# Patient Record
Sex: Male | Born: 1975
Health system: Southern US, Community
[De-identification: ages and names within clinical notes are randomized; demographics above are authoritative.]

## PROBLEM LIST (undated history)

## (undated) DIAGNOSIS — K219 Gastro-esophageal reflux disease without esophagitis: Secondary | ICD-10-CM

## (undated) DIAGNOSIS — Z8614 Personal history of Methicillin resistant Staphylococcus aureus infection: Secondary | ICD-10-CM

## (undated) DIAGNOSIS — R112 Nausea with vomiting, unspecified: Secondary | ICD-10-CM

## (undated) DIAGNOSIS — J189 Pneumonia, unspecified organism: Secondary | ICD-10-CM

## (undated) DIAGNOSIS — T7840XA Allergy, unspecified, initial encounter: Secondary | ICD-10-CM

## (undated) DIAGNOSIS — F329 Major depressive disorder, single episode, unspecified: Secondary | ICD-10-CM

## (undated) DIAGNOSIS — F32A Depression, unspecified: Secondary | ICD-10-CM

## (undated) DIAGNOSIS — F17200 Nicotine dependence, unspecified, uncomplicated: Secondary | ICD-10-CM

## (undated) DIAGNOSIS — F419 Anxiety disorder, unspecified: Secondary | ICD-10-CM

## (undated) DIAGNOSIS — J449 Chronic obstructive pulmonary disease, unspecified: Secondary | ICD-10-CM

## (undated) DIAGNOSIS — Z9889 Other specified postprocedural states: Secondary | ICD-10-CM

## (undated) HISTORY — DX: Allergy, unspecified, initial encounter: T78.40XA

## (undated) HISTORY — DX: Major depressive disorder, single episode, unspecified: F32.9

## (undated) HISTORY — DX: Gastro-esophageal reflux disease without esophagitis: K21.9

## (undated) HISTORY — DX: Nicotine dependence, unspecified, uncomplicated: F17.200

## (undated) HISTORY — DX: Chronic obstructive pulmonary disease, unspecified: J44.9

## (undated) HISTORY — PX: BACK SURGERY: SHX140

## (undated) HISTORY — DX: Depression, unspecified: F32.A

## (undated) HISTORY — PX: ROTATOR CUFF REPAIR: SHX139

## (undated) HISTORY — DX: Personal history of Methicillin resistant Staphylococcus aureus infection: Z86.14

---

## 1998-01-24 ENCOUNTER — Emergency Department (HOSPITAL_COMMUNITY): Admission: EM | Admit: 1998-01-24 | Discharge: 1998-01-24 | Payer: Self-pay | Admitting: Emergency Medicine

## 1998-02-01 ENCOUNTER — Emergency Department (HOSPITAL_COMMUNITY): Admission: EM | Admit: 1998-02-01 | Discharge: 1998-02-01 | Payer: Self-pay | Admitting: Emergency Medicine

## 1998-09-30 ENCOUNTER — Emergency Department (HOSPITAL_COMMUNITY): Admission: EM | Admit: 1998-09-30 | Discharge: 1998-09-30 | Payer: Self-pay | Admitting: Emergency Medicine

## 1998-09-30 ENCOUNTER — Encounter: Payer: Self-pay | Admitting: Emergency Medicine

## 1999-06-07 ENCOUNTER — Encounter: Payer: Self-pay | Admitting: Emergency Medicine

## 1999-06-07 ENCOUNTER — Emergency Department (HOSPITAL_COMMUNITY): Admission: EM | Admit: 1999-06-07 | Discharge: 1999-06-07 | Payer: Self-pay | Admitting: Emergency Medicine

## 2000-08-06 ENCOUNTER — Encounter (INDEPENDENT_AMBULATORY_CARE_PROVIDER_SITE_OTHER): Payer: Self-pay | Admitting: *Deleted

## 2000-08-06 ENCOUNTER — Ambulatory Visit (HOSPITAL_COMMUNITY): Admission: RE | Admit: 2000-08-06 | Discharge: 2000-08-06 | Payer: Self-pay | Admitting: Gastroenterology

## 2000-08-07 ENCOUNTER — Ambulatory Visit (HOSPITAL_COMMUNITY): Admission: RE | Admit: 2000-08-07 | Discharge: 2000-08-07 | Payer: Self-pay | Admitting: Gastroenterology

## 2000-08-07 ENCOUNTER — Encounter: Payer: Self-pay | Admitting: Gastroenterology

## 2000-09-21 ENCOUNTER — Emergency Department (HOSPITAL_COMMUNITY): Admission: EM | Admit: 2000-09-21 | Discharge: 2000-09-21 | Payer: Self-pay | Admitting: Emergency Medicine

## 2000-10-26 ENCOUNTER — Encounter: Payer: Self-pay | Admitting: Internal Medicine

## 2000-10-26 ENCOUNTER — Emergency Department (HOSPITAL_COMMUNITY): Admission: EM | Admit: 2000-10-26 | Discharge: 2000-10-26 | Payer: Self-pay | Admitting: Internal Medicine

## 2002-01-06 ENCOUNTER — Encounter: Payer: Self-pay | Admitting: Internal Medicine

## 2002-01-06 ENCOUNTER — Ambulatory Visit (HOSPITAL_COMMUNITY): Admission: RE | Admit: 2002-01-06 | Discharge: 2002-01-06 | Payer: Self-pay | Admitting: Internal Medicine

## 2002-01-10 ENCOUNTER — Encounter: Payer: Self-pay | Admitting: Neurology

## 2002-01-15 ENCOUNTER — Ambulatory Visit (HOSPITAL_COMMUNITY): Admission: RE | Admit: 2002-01-15 | Discharge: 2002-01-15 | Payer: Self-pay | Admitting: Neurology

## 2003-10-23 ENCOUNTER — Emergency Department (HOSPITAL_COMMUNITY): Admission: EM | Admit: 2003-10-23 | Discharge: 2003-10-24 | Payer: Self-pay | Admitting: Emergency Medicine

## 2004-05-02 ENCOUNTER — Encounter: Admission: RE | Admit: 2004-05-02 | Discharge: 2004-05-02 | Payer: Self-pay | Admitting: Family Medicine

## 2004-05-26 ENCOUNTER — Encounter: Admission: RE | Admit: 2004-05-26 | Discharge: 2004-05-26 | Payer: Self-pay | Admitting: Family Medicine

## 2004-05-27 ENCOUNTER — Inpatient Hospital Stay (HOSPITAL_COMMUNITY): Admission: EM | Admit: 2004-05-27 | Discharge: 2004-06-02 | Payer: Self-pay | Admitting: Internal Medicine

## 2004-06-27 ENCOUNTER — Ambulatory Visit: Payer: Self-pay | Admitting: Internal Medicine

## 2004-07-04 ENCOUNTER — Ambulatory Visit: Payer: Self-pay | Admitting: Internal Medicine

## 2004-07-08 ENCOUNTER — Ambulatory Visit: Payer: Self-pay | Admitting: Internal Medicine

## 2004-07-22 ENCOUNTER — Ambulatory Visit: Payer: Self-pay | Admitting: Internal Medicine

## 2004-08-18 ENCOUNTER — Ambulatory Visit: Payer: Self-pay | Admitting: Internal Medicine

## 2004-09-26 ENCOUNTER — Ambulatory Visit: Payer: Self-pay | Admitting: Internal Medicine

## 2006-01-30 ENCOUNTER — Emergency Department (HOSPITAL_COMMUNITY): Admission: EM | Admit: 2006-01-30 | Discharge: 2006-01-30 | Payer: Self-pay | Admitting: Emergency Medicine

## 2006-05-14 ENCOUNTER — Ambulatory Visit: Payer: Self-pay | Admitting: Family Medicine

## 2006-05-21 ENCOUNTER — Ambulatory Visit: Payer: Self-pay | Admitting: Family Medicine

## 2006-07-05 ENCOUNTER — Ambulatory Visit: Payer: Self-pay | Admitting: Family Medicine

## 2006-09-26 ENCOUNTER — Ambulatory Visit: Payer: Self-pay | Admitting: Family Medicine

## 2006-11-01 ENCOUNTER — Encounter: Admission: RE | Admit: 2006-11-01 | Discharge: 2006-11-01 | Payer: Self-pay | Admitting: Family Medicine

## 2006-11-01 ENCOUNTER — Encounter (INDEPENDENT_AMBULATORY_CARE_PROVIDER_SITE_OTHER): Payer: Self-pay | Admitting: *Deleted

## 2006-11-01 ENCOUNTER — Ambulatory Visit: Payer: Self-pay | Admitting: Family Medicine

## 2006-11-02 ENCOUNTER — Ambulatory Visit: Payer: Self-pay | Admitting: Family Medicine

## 2007-06-07 ENCOUNTER — Ambulatory Visit: Payer: Self-pay | Admitting: Family Medicine

## 2007-06-12 ENCOUNTER — Ambulatory Visit: Payer: Self-pay | Admitting: Family Medicine

## 2007-07-22 ENCOUNTER — Ambulatory Visit: Payer: Self-pay | Admitting: Family Medicine

## 2007-12-02 ENCOUNTER — Ambulatory Visit: Payer: Self-pay | Admitting: Family Medicine

## 2007-12-04 ENCOUNTER — Emergency Department (HOSPITAL_COMMUNITY): Admission: EM | Admit: 2007-12-04 | Discharge: 2007-12-04 | Payer: Self-pay | Admitting: Emergency Medicine

## 2008-01-20 ENCOUNTER — Inpatient Hospital Stay (HOSPITAL_COMMUNITY): Admission: RE | Admit: 2008-01-20 | Discharge: 2008-01-22 | Payer: Self-pay | Admitting: *Deleted

## 2008-01-20 ENCOUNTER — Ambulatory Visit: Payer: Self-pay | Admitting: *Deleted

## 2008-01-20 ENCOUNTER — Emergency Department (HOSPITAL_COMMUNITY): Admission: EM | Admit: 2008-01-20 | Discharge: 2008-01-20 | Payer: Self-pay | Admitting: Emergency Medicine

## 2008-01-20 ENCOUNTER — Ambulatory Visit: Payer: Self-pay | Admitting: Family Medicine

## 2008-01-27 ENCOUNTER — Ambulatory Visit: Payer: Self-pay | Admitting: Family Medicine

## 2008-01-28 ENCOUNTER — Ambulatory Visit (HOSPITAL_COMMUNITY): Payer: Self-pay | Admitting: Psychiatry

## 2008-01-30 ENCOUNTER — Ambulatory Visit (HOSPITAL_COMMUNITY): Payer: Self-pay | Admitting: Psychology

## 2008-02-03 ENCOUNTER — Ambulatory Visit: Payer: Self-pay | Admitting: Family Medicine

## 2008-04-16 ENCOUNTER — Ambulatory Visit: Payer: Self-pay | Admitting: Family Medicine

## 2008-05-15 ENCOUNTER — Ambulatory Visit: Payer: Self-pay | Admitting: Family Medicine

## 2008-06-11 ENCOUNTER — Ambulatory Visit: Payer: Self-pay | Admitting: Family Medicine

## 2008-06-30 ENCOUNTER — Ambulatory Visit: Payer: Self-pay | Admitting: Family Medicine

## 2008-07-03 ENCOUNTER — Ambulatory Visit (HOSPITAL_COMMUNITY): Payer: Self-pay | Admitting: Psychology

## 2008-08-02 ENCOUNTER — Emergency Department (HOSPITAL_COMMUNITY): Admission: EM | Admit: 2008-08-02 | Discharge: 2008-08-02 | Payer: Self-pay | Admitting: Emergency Medicine

## 2008-08-18 ENCOUNTER — Inpatient Hospital Stay: Payer: Self-pay | Admitting: Psychiatry

## 2008-09-24 ENCOUNTER — Emergency Department (HOSPITAL_COMMUNITY): Admission: EM | Admit: 2008-09-24 | Discharge: 2008-09-24 | Payer: Self-pay | Admitting: Emergency Medicine

## 2008-09-24 ENCOUNTER — Encounter: Admission: RE | Admit: 2008-09-24 | Discharge: 2008-09-24 | Payer: Self-pay | Admitting: Occupational Medicine

## 2008-11-11 ENCOUNTER — Emergency Department (HOSPITAL_COMMUNITY): Admission: EM | Admit: 2008-11-11 | Discharge: 2008-11-11 | Payer: Self-pay | Admitting: Emergency Medicine

## 2008-11-19 HISTORY — PX: JOINT REPLACEMENT: SHX530

## 2008-12-02 ENCOUNTER — Ambulatory Visit: Payer: Self-pay | Admitting: Internal Medicine

## 2008-12-02 DIAGNOSIS — F17200 Nicotine dependence, unspecified, uncomplicated: Secondary | ICD-10-CM | POA: Insufficient documentation

## 2008-12-02 DIAGNOSIS — R0609 Other forms of dyspnea: Secondary | ICD-10-CM | POA: Insufficient documentation

## 2008-12-02 DIAGNOSIS — J45909 Unspecified asthma, uncomplicated: Secondary | ICD-10-CM | POA: Insufficient documentation

## 2008-12-02 DIAGNOSIS — R062 Wheezing: Secondary | ICD-10-CM | POA: Insufficient documentation

## 2008-12-02 DIAGNOSIS — R0989 Other specified symptoms and signs involving the circulatory and respiratory systems: Secondary | ICD-10-CM

## 2008-12-09 ENCOUNTER — Inpatient Hospital Stay (HOSPITAL_COMMUNITY): Admission: RE | Admit: 2008-12-09 | Discharge: 2008-12-10 | Payer: Self-pay | Admitting: Orthopedic Surgery

## 2009-01-13 ENCOUNTER — Encounter: Payer: Self-pay | Admitting: Internal Medicine

## 2009-01-13 ENCOUNTER — Telehealth: Payer: Self-pay | Admitting: Internal Medicine

## 2009-05-13 ENCOUNTER — Ambulatory Visit: Payer: Self-pay | Admitting: Family Medicine

## 2010-02-10 ENCOUNTER — Ambulatory Visit: Payer: Self-pay | Admitting: Family Medicine

## 2010-02-10 ENCOUNTER — Encounter: Payer: Self-pay | Admitting: Internal Medicine

## 2010-02-11 ENCOUNTER — Telehealth: Payer: Self-pay | Admitting: Gastroenterology

## 2010-02-11 ENCOUNTER — Ambulatory Visit: Payer: Self-pay | Admitting: Internal Medicine

## 2010-02-11 DIAGNOSIS — R079 Chest pain, unspecified: Secondary | ICD-10-CM | POA: Insufficient documentation

## 2010-02-18 ENCOUNTER — Ambulatory Visit: Payer: Self-pay | Admitting: Gastroenterology

## 2010-05-23 ENCOUNTER — Telehealth (INDEPENDENT_AMBULATORY_CARE_PROVIDER_SITE_OTHER): Payer: Self-pay | Admitting: *Deleted

## 2010-05-23 ENCOUNTER — Ambulatory Visit: Payer: Self-pay | Admitting: Internal Medicine

## 2010-05-24 ENCOUNTER — Encounter: Payer: Self-pay | Admitting: Gastroenterology

## 2010-06-21 ENCOUNTER — Ambulatory Visit: Payer: Self-pay | Admitting: Family Medicine

## 2010-09-12 ENCOUNTER — Ambulatory Visit
Admission: RE | Admit: 2010-09-12 | Discharge: 2010-09-12 | Payer: Self-pay | Source: Home / Self Care | Attending: Family Medicine | Admitting: Family Medicine

## 2010-09-20 NOTE — Progress Notes (Signed)
Summary: Rectal/Abd. Pain  Phone Note Call from Patient Call back at [phone]   Caller: Patient Reason for Call: Talk to Nurse Summary of Call: pt. is having severe pain w/his hemorroids and lower abd pain. Requesting sooner appt. than next avail for new pt. Initial call taken by: Karna Christmas,  May 23, 2010 10:37 AM  Follow-up for Phone Call        Pt. will see Mike Gip [facility] today at 1:30pm. Pt's wife to advise pt. on med.list/co-pay. Follow-up by: Laureen Ochs LPN,  May 23, 2010 11:26 AM

## 2010-09-20 NOTE — Procedures (Signed)
Summary: EGD   EGD  Procedure date:  08/06/2000  Findings:      Location: [facility]                          [facility]  Patient:    Evan Bauer, Evan Bauer                    MRN: [id number] Proc. Date: 08/06/00 Adm. Date:  [id number] Attending:  Louie Bun CC:         Ammie Dalton, M.D.   Operative Report  PROCEDURE:  Esophagogastroduodenoscopy.  INDICATION FOR PROCEDURE:  Nausea, vomiting, abdominal pain, and black stool suggesting upper GI bleeding.  DESCRIPTION OF PROCEDURE: The patient was placed in the left lateral decubitus position and placed on the pulse monitor with continuous low-flow oxygen delivered by nasal cannula.  He was sedated with 60 mg IV Demerol and 6 mg IV Versed.  The Olympus video endoscope was advanced under direct vision into the oropharynx and esophagus.  The esophagus was straight and of normal caliber with the squamocolumnar line at 38 cm.  There appeared to be a 2 cm sliding hiatal hernia distal to the Z-line.  There also appeared to be 1-2 discrete erosions extending 1-2 cm from the Z-line with no stigma of hemorrhage.  I did not discern any Mallory-Weiss tear, varices, esophageal ulcer, or other potential bleeding lesion of the distal esophagus or GE junction.  The stomach was entered, and there was a moderate amount of semisolid food in the dependent portions of the fundus.  The patient was known to have eaten 1/2 a chicken sandwich about five hours prior to this procedure.  This limited somewhat the view of the proximal stomach, but there were no visible coffee grounds, blood, or bloody fluid anywhere in the stomach.  The visualized portions of the proximal stomach appeared normal as did the body and antrum which were well visualized.  The pylorus was nondeformed and easily allowed passage of the endoscope tip into the duodenum.  Both bulb and second portion were well inspected and appeared to be within normal  limits.  The endoscope was then withdrawn, and the patient returned to the recovery room in stable condition.  He tolerated the procedure well, and there were no immediate complications.  IMPRESSION:  Moderate erosive esophagitis with no stigma of hemorrhage.  No suggestion of active bleeding proximal to the distal duodenum.  PLAN:  Await lab work to determine whether observation admission is warranted to observe for ongoing GI bleeding and whether lower GI evaluation is needed. DD:  08/06/00 TD:  08/07/00 Job: [id number]

## 2010-09-20 NOTE — Progress Notes (Signed)
  Phone Note From Other Clinic   Summary of Call: Dr. Tenny Craw called about getting him in for appt sooner than mid August (was told this was first available) for NCCP.  he will double his PPI for now.  can you find him a spot in next 1-2 weeks with me, PA or anyone with any openings. Initial call taken by: Rachael Fee MD,  February 11, 2010 2:40 PM     Appended Document:  Given appt. for 02/18/10. wife ntfd.

## 2010-09-20 NOTE — Assessment & Plan Note (Signed)
Summary: np6/ chestpain. arrived @ 1p.m. pt has uhc/ gd   Visit Type:  Initial Consult Primary Provider:  Dr. Susann Givens  CC:  chest pain.  History of Present Illness: Patient is a 35 year old who was referred from Dr. Levonne Spiller office for chest pain. The patient has no known CAD.  On Tuesday he says he was at work.  It was after lunch.  He developed chest tightness.  Became very pale.  Felt sick on stomach.  Short of breath.  Symptoms would wax and wane over next few days.  Episodes were not associated with activity. Note he has a history of erosive esophagitis in2001.  Has not been seen by GI after this.  Does say that he has some uncontrolled reflux symptoms on daily protonix.  Current Medications (verified): 1)  Symbicort 160-4.5 Mcg/act Aero (Budesonide-Formoterol Fumarate) .... 2 Puffs Two Times A Day 2)  Zyrtec Allergy 10 Mg Tabs (Cetirizine Hcl) .... Take 1 Tablet By Mouth Once A Day 3)  Remeron 45 Mg Tabs (Mirtazapine) .Marland Kitchen.. 1 Tab At Bedtime 4)  Effexor Xr 75 Mg Xr24h-Cap (Venlafaxine Hcl) .Marland Kitchen.. 1 Tab Once Daily 5)  Protonix 40 Mg Tbec (Pantoprazole Sodium) .Marland Kitchen.. 1 Tab Once Daily  Allergies (verified): 1)  ! Penicillin 2)  ! Levaquin 3)  ! * Chantix  Past History:  Past Medical History: Last updated: 12/02/2008 #Asthma..............................Marland KitchenDr Marchelle Gearing -> hx of seeing Dr. Sherene Sires prior to 2005. 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 No hx of controller medications up until 10/2008 -> acute bronchospasm at time of scalene block for rotator cuff repair 11/11/2008 -deemed as VCD clinically by inpatient consultation Dr. Delton Coombes. STarted on Symbicort and nasonex empricially -> 12/02/2008 Office spiro  on symbicort - Shows mild obstn. FeV1 3.74L/78%, FVC 4.8L/83%, Ratio 78 (83), Fef 25-75 3.4L67% Seasonal Allergies #GERD Hx #Mood Disorder NOS  -> admitted [facility] June 2009. Per patient this was due to hallucinations from chantix -> depression  that patient claims is due to chantix #Tobacco Abuse -> hx of quitting pulm followup in 2005 due to desire to continue smoking.  -> difficulty quitting due to depression and chantix side effects #Obesity #Hx of Snoring and Excess day time somnolence -> sleep study 2003 hx - normal per patient. 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  NEgatvie to the above problem.  Does note that he is constipated.  Denies food getting stuck with swallowing. Lipids in medicne office:  LDL 105; HDL 25; Trig 194   Vital Signs:  Patient profile:   35 year old male Height:      72 inches Weight:      207 pounds BMI:     28.18 Pulse rate:   72 / minute BP sitting:   117 / 81  (left arm) Cuff size:   regular  Vitals Entered By: Burnett Kanaris, CNA (February 11, 2010 1:50 PM)  Physical Exam  Additional Exam:  Patient is in NAD. HEENT:  Normocephalic, atraumatic.  EOMI, PERRLA.  Neck: JVP is normal. No thyromegaly. No bruits.  Chest Mild tenderness, different from discomfort he experienced this week. Lungs: clear to auscultation. No rales no wheezes.  Heart: Regular rate and rhythm. Normal S1, S2. No S3.   No significant murmurs. PMI not displaced.  Abdomen:  Supple. Very mild right sided tendernaess.. Normal bowel sounds. No masses. No hepatomegaly.  Extremities:   Good distal pulses throughout. No lower extremity edema.  Musculoskeletal :moving all extremities.  Neuro:   alert and oriented x3.    Impression &  Recommendations:  Problem # 1:  CHEST PAIN-UNSPECIFIED (ICD-786.50) I am not convinced that the patient's current chest pain is  cardiac.  On and off, not associated with activiity at all. For the extent of pain that he has had i  would expect to see more evid of problems. He does have a GI Hx. and notes that he is having break through reflux.  (and also constipation). I have recommended that he increase Protonix to two times a day.  Also take surfak.  I have contacted GI Kennedy Bucker) and will have him set up for an appt in the next 1 to 2 wks.  Problem # 2:  TOBACCO ABUSE (ICD-305.1) Counselled.  He is not interested in quitting now since he was suicidal on Chantix. Prescriptions: COLACE 100 MG CAPS (DOCUSATE SODIUM) 1 cap two times a day  #60 x 6   Entered by:   Layne Benton, RN, BSN   Authorized by:   Sherrill Raring, MD, [facility]   Signed by:   Layne Benton, RN, BSN on 02/11/2010   Method used:   Electronically to        [facility] (retail)       [address]       [place], [place]  [id number]       Ph: [phone]       Fax: [phone]   RxID:   [id number] PROTONIX 40 MG TBEC (PANTOPRAZOLE SODIUM) 1 tab 2 times per day  #60 x 1   Entered by:   Layne Benton, RN, BSN   Authorized by:   Sherrill Raring, MD, [facility]   Signed by:   Layne Benton, RN, BSN on 02/11/2010   Method used:   Electronically to        [facility] (retail)       [address]       [place], [place]  [id number]       Ph: [phone]       Fax: [phone]   RxID:   [id number]

## 2010-09-20 NOTE — Letter (Signed)
Summary: [facility]   Imported By: Sherian Rein 06/07/2010 11:16:41  _____________________________________________________________________  External Attachment:    Type:   Image     Comment:   External Document

## 2010-09-20 NOTE — Letter (Signed)
Summary: [facility]  [facility]   Imported By: Marylou Mccoy 03/02/2010 12:23:06  _____________________________________________________________________  External Attachment:    Type:   Image     Comment:   External Document

## 2010-09-20 NOTE — Assessment & Plan Note (Signed)
Summary: SEVERE RECTAL PAIN AND LOWER ABD. PAIN.    (NEW TO GI)   Evan Bauer   History of Present Illness Visit Type: Initial Visit Primary GI MD: Rob Bunting MD Primary Provider: Sharlot Gowda, MD Chief Complaint: severe rectal and abdominal pain History of Present Illness:   PLEASANT 34 YO MALE NEW TO GI TODAY. HE REPOTS HX OF HEMORRHOID SXS IN THE PAST BUT SAYS HAS MANAGED HIMSELF IN THE PAST.  HE COMES IN TODAY AFTER HAVING A SMALL AMT OF RECTAL BLEEDING ON 05/20/10. NO OTHER SXS UNTIL LAST NIGHT AFTER HE HAD DONE HEAVY LABOR FOR SEVERAL HOURS YESTERDAY. HE BEGAN HAVING RECTAL PAIN WHICH HAS PROGRESSED AND KEPT HIM FROM SLEEPING LAST NIGHT. HE CANNOT SIT WITHOUT INCREASED PAIN. NO BM PAST 2 DAYS. HE FEELS A KNOT ON THE OUTSIDE OF HIS RECTUM AND PAIN INTERNALLY. NO FEVER/CHILLS.  HE IS ON TWICE DAILY PROTONIX  BECAUSE OF RELUX WHICH REPOTEDLY WAS CAUSING BRONCHOSPASM, NO CURRENT SXS.   GI Review of Systems      Denies abdominal pain, acid reflux, belching, bloating, chest pain, dysphagia with liquids, dysphagia with solids, heartburn, loss of appetite, nausea, vomiting, vomiting blood, weight loss, and  weight gain.      Reports hemorrhoids, rectal bleeding, and  rectal pain.     Denies anal fissure, black tarry stools, change in bowel habit, constipation, diarrhea, diverticulosis, fecal incontinence, heme positive stool, irritable bowel syndrome, jaundice, light color stool, and  liver problems.    Current Medications (verified): 1)  Symbicort 160-4.5 Mcg/act Aero (Budesonide-Formoterol Fumarate) .... 2 Puffs Two Times A Day 2)  Zyrtec Allergy 10 Mg Tabs (Cetirizine Hcl) .... Take 1 Tablet By Mouth Once A Day 3)  Remeron 45 Mg Tabs (Mirtazapine) .Marland Kitchen.. 1 Tab At Bedtime 4)  Effexor Xr 75 Mg Xr24h-Cap (Venlafaxine Hcl) .Marland Kitchen.. 1 Tab Once Daily 5)  Protonix 40 Mg Tbec (Pantoprazole Sodium) .Marland Kitchen.. 1 Tab 2 Times Per Day 6)  Colace 100 Mg Caps (Docusate Sodium) .Marland Kitchen.. 1 Cap Two Times A  Day  Allergies: 1)  ! Penicillin 2)  ! Levaquin 3)  ! * Chantix  Past History:  Past Medical History: #Asthma..............................Marland KitchenDr Marchelle Gearing -> hx of seeing Dr. Sherene Sires prior to 2005. 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 No hx of controller medications up until 10/2008 -> acute bronchospasm at time of scalene block for rotator cuff repair 11/11/2008 -deemed as VCD clinically by inpatient consultation Dr. Delton Coombes. STarted on Symbicort and nasonex empricially -> 12/02/2008 Office spiro  on symbicort - Shows mild obstn. FeV1 3.74L/78%, FVC 4.8L/83%, Ratio 78 (83), Fef 25-75 3.4L67% Seasonal Allergies #GERD Hx #Mood Disorder NOS  -> admitted [facility] June 2009. Per patient this was due to hallucinations from chantix -> depression that patient claims is due to chantix #Tobacco Abuse -> hx of quitting pulm followup in 2005 due to desire to continue smoking.  -> difficulty quitting due to depression and chantix side effects #Obesity #Hx of Snoring and Excess day time somnolence -> sleep study 2003 hx - normal per patient. -> weight gain 50# since 2003 but typically loses 20# in summer due to work  Past Surgical History: Reviewed history from 12/02/2008 and no changes required. Right Rotator Cuff Tear - surgery pending Dr Thurston Hole  Family History: Reviewed history from 12/02/2008 and no changes required. Father-emphysema, allergies, asthma No FH of Colon Cancer:  Social History: Reviewed history from 02/11/2010 and no changes required. 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  The patient denies anemia, anxiety-new, arthritis/joint pain, back pain, blood in urine, breast changes/lumps, change in vision, confusion, cough, coughing up blood, depression-new, fainting, fatigue, fever, headaches-new, hearing  problems, heart murmur, heart rhythm changes, itching, muscle pains/cramps, night sweats, nosebleeds, shortness of breath, skin rash, sleeping problems, sore throat, swelling of feet/legs, swollen lymph glands, thirst - excessive, urination - excessive, urination changes/pain, urine leakage, vision changes, and voice change.         SEE HPI  Vital Signs:  Patient profile:   35 year old male Height:      72 inches Weight:      223.38 pounds BMI:     30.41 Pulse rate:   100 / minute Pulse rhythm:   regular BP sitting:   138 / 92  (left arm)  Vitals Entered By: Milford Cage NCMA (May 23, 2010 1:16 PM)  Physical Exam  General:  Well developed, well nourished, no acute distress. Head:  Normocephalic and atraumatic. Eyes:  PERRLA, no icterus. Lungs:  Clear throughout to auscultation. Heart:  Regular rate and rhythm; no murmurs, rubs,  or bruits. Abdomen:  SOFT, NONTENDER, NO MASS OR HSM,BS+ Rectal:  PERIRECTAL INDURATION,ERYTHEMA AND EXQUISITE TENDERNESS ON LEFT  ,VERY TENDER DIGITAL EXAM,AND ANOSCOPY-NO HEMORRHOID OR FISSURE IDENTIFIED, SOME INDURATION LEFT ANORECTUM. Neurologic:  Alert and  oriented x4;  grossly normal neurologically. Psych:  Alert and cooperative. Normal mood and affect.   Impression & Recommendations:  Problem # 1:  PERI-RECTAL ABSCESS Assessment New 34 YO MALE WITH LEFT PERIRECTAL ABSCESS.  START KEFLEX 500 MG 3 TIMES DAILY X 10  DAYS(ALLERGIC TO PCN, AND QUINOLONES) SITZ BATHS TWICE DAILY WITH HOT WATER SURGICAL REFERRAL WITHIN NEXT 24 HOURS. OFFERED ANALGESIC-HE DECLINES, WILL USE EXTRA STRENGTH TYLENOL  AS NEEDED.  Patient Instructions: 1)  We called a prescription to [facility]  for Keflex. 2)  You can take Extrastrength Tylenol.  3)  You can do warm soaks in the bathtub for 10 min several times before appointment at [facility]. 4)  Your appointment is on tomorrow 05-24-10, arrive at 4:00 PM to [facility]. 5)   Appoiontment sheet with directions given. 6)  Copy sent to :  Sharlot Gowda, Md 7)  The medication list was reviewed and reconciled.  All changed / newly prescribed medications were explained.  A complete medication list was provided to the patient / caregiver. Prescriptions: KEFLEX 500 MG CAPS (CEPHALEXIN) Take 1 tab every 8 hours x 10 days  #30 x 0   Entered by:   Lowry Ram NCMA   Authorized by:   Sammuel Cooper PA-c   Signed by:   Lowry Ram NCMA on 05/23/2010   Method used:   Electronically to        [facility] (retail)       [address]       [place], [place]  [id number]       Ph: [phone]       Fax: [phone]   RxID:   [id number]

## 2010-09-20 NOTE — Consult Note (Signed)
[facility]  Patient:    Evan Bauer, Evan Bauer                    MRN: [id number] Proc. Date: 08/06/00 Adm. Date:  [id number] Attending:  Hayes, John Charles CC:         Cynthia Childress, M.D.   Consultation Report  REASON FOR CONSULTATION:  Nausea, vomiting, abdominal pain and heme-positive stool.  HISTORY OF ILLNESS:  Patient is a 35-year-old white male who was in previously good health until he began experiencing a headache and malaise Saturday night, followed by the development of periumbilical abdominal pain Sunday morning. He noted that the pain tended to be worse when he was sitting or standing and better when lying down.  He vomited once yesterday with the appearance of partially digested food and no coffee-grounds.  He does not recall having a bowel movement yesterday.  He went to work early in the day and after he vomited, came home and stayed in bed the remainder of the day and night.  This morning when he awoke, he began having abdominal pain again and had a total of four bowel movements which are described as black and somewhat tarry, without diarrhea or bright red blood per rectum.  He was confirmed to be heme-positive, with a melenic appearance to his stool, by Dr. Cynthia Childress and referred here.  Vital signs are stable and CBC and BMET are pending at the time of this dictation.  Patient denies any history of GI bleeding, peptic ulcer disease or use of nonsteroidal anti-inflammatory drugs.  PAST MEDICAL HISTORY:  Essentially unremarkable.  PAST SURGICAL HISTORY:  None.  HOSPITALIZATIONS:  None.  ALLERGIES:  PENICILLIN.  FAMILY HISTORY:  Father has Alzheimers disease.  No family history of peptic ulcer disease.  SOCIAL HISTORY:  Patient is married.  He has two jobs.  His wife is expecting their first child.  He admits to cigarette smoking and denies alcohol use.  PHYSICAL EXAMINATION  GENERAL:  Well-developed, well-nourished  white male in no acute distress.  HEENT:  Unremarkable.  HEART:  Regular rate and rhythm without murmur.  LUNGS:  Clear.  ABDOMEN:  Soft, nondistended, with normoactive bowel sounds.  No hepatosplenomegaly, mass or guarding.  There is mild periumbilical abdominal tenderness.  RECTAL:  Exam was not repeated.  LABORATORY DATA:  Pending.  IMPRESSION:  Nausea, vomiting, abdominal pain and black stools suggestive of an upper gastrointestinal bleed.  PLAN:  Will proceed with esophagogastroduodenoscopy.  Will await CBC and BMET as well. DD:  08/06/00 TD:  08/07/00 Job: [id number]    [facility]  Patient:    Evan Bauer                    MRN: [id number] Proc. Date: 08/06/00 Adm. Date:  [id number] Attending:  Louie Bun CC:         Ammie Dalton, M.D.   Consultation Report  REASON FOR CONSULTATION:  Nausea, vomiting, abdominal pain and heme-positive stool.  HISTORY OF ILLNESS:  Patient is a 35 year old white male who was in previously good health until he began experiencing a headache and malaise Saturday night, followed by the development of periumbilical abdominal pain Sunday morning. He noted that the pain tended to be worse when he was sitting or standing and better when lying down.  He vomited once yesterday with the appearance of partially digested food and no coffee-grounds.  He does not recall having a bowel movement yesterday.  He went to work early in the day and after he vomited, came home and stayed in bed the remainder of the day and night.  This morning when he awoke, he began having abdominal pain again and had a total of four bowel movements which are described as black and somewhat tarry, without diarrhea or bright red blood per rectum.  He was confirmed to be heme-positive, with a melenic appearance to his stool, by Dr. Ammie Dalton and referred here.  Vital signs are stable and CBC and BMET are pending at the time of this dictation.  Patient denies any history of GI bleeding, peptic ulcer disease or use of nonsteroidal anti-inflammatory drugs.  PAST MEDICAL HISTORY:  Essentially unremarkable.  PAST SURGICAL HISTORY:  None.  HOSPITALIZATIONS:  None.  ALLERGIES:  PENICILLIN.  FAMILY HISTORY:  Father has Alzheimers disease.  No family history of peptic ulcer disease.  SOCIAL HISTORY:  Patient is married.  He has two jobs.  His wife is expecting their first child.  He admits to cigarette smoking and denies alcohol use.  PHYSICAL EXAMINATION  GENERAL:  Well-developed,  well-nourished white male in no acute distress.  HEENT:  Unremarkable.  HEART:  Regular rate and rhythm without murmur.  LUNGS:  Clear.  ABDOMEN:  Soft, nondistended, with normoactive bowel sounds.  No hepatosplenomegaly, mass or guarding.  There is mild periumbilical abdominal tenderness.  RECTAL:  Exam was not repeated.  LABORATORY DATA:  Pending.  IMPRESSION:  Nausea, vomiting, abdominal pain and black stools suggestive of an upper gastrointestinal bleed.  PLAN:  Will proceed with esophagogastroduodenoscopy.  Will await CBC and BMET as well. DD:  08/06/00 TD:  08/07/00 Job: [id number]

## 2010-09-23 ENCOUNTER — Ambulatory Visit: Payer: Self-pay | Admitting: Family Medicine

## 2010-11-15 ENCOUNTER — Other Ambulatory Visit: Payer: Self-pay

## 2010-11-15 DIAGNOSIS — K219 Gastro-esophageal reflux disease without esophagitis: Secondary | ICD-10-CM

## 2010-11-15 MED ORDER — PANTOPRAZOLE SODIUM 40 MG PO TBEC: 40.0000 mg | DELAYED_RELEASE_TABLET | Freq: Two times a day (BID) | ORAL | Status: DC

## 2010-11-30 LAB — BASIC METABOLIC PANEL
BUN: 6 mg/dL (ref 6–23)
CO2: 30 mEq/L (ref 19–32)
Calcium: 9 mg/dL (ref 8.4–10.5)
Chloride: 101 mEq/L (ref 96–112)
Creatinine, Ser: 1.07 mg/dL (ref 0.4–1.5)
GFR calc Af Amer: >60 mL/min (ref >60)
GFR calc non Af Amer: >60 mL/min (ref >60)
Glucose, Bld: 117 mg/dL — ABNORMAL HIGH (ref 70–99)
Potassium: 3.4 mEq/L — ABNORMAL LOW (ref 3.5–5.1)
Sodium: 136 mEq/L (ref 135–145)

## 2010-11-30 LAB — CBC
HCT: 44.8 % (ref 39.0–52.0)
HCT: 46.8 % (ref 39.0–52.0)
Hemoglobin: 15.8 g/dL (ref 13.0–17.0)
Hemoglobin: 16.2 g/dL (ref 13.0–17.0)
MCHC: 34.7 g/dL (ref 30.0–36.0)
MCHC: 35.2 g/dL (ref 30.0–36.0)
MCV: 90.3 fL (ref 78.0–100.0)
MCV: 90.8 fL (ref 78.0–100.0)
Platelets: 228 10*3/uL (ref 150–400)
Platelets: 271 10*3/uL (ref 150–400)
RBC: 4.96 MIL/uL (ref 4.22–5.81)
RBC: 5.15 MIL/uL (ref 4.22–5.81)
RDW: 12.6 % (ref 11.5–15.5)
RDW: 12.6 % (ref 11.5–15.5)
WBC: 11.9 10*3/uL — ABNORMAL HIGH (ref 4.0–10.5)
WBC: 7.1 10*3/uL (ref 4.0–10.5)

## 2010-11-30 LAB — COMPREHENSIVE METABOLIC PANEL
ALT: 24 U/L (ref 0–53)
AST: 20 U/L (ref 0–37)
Albumin: 3.8 g/dL (ref 3.5–5.2)
Alkaline Phosphatase: 75 U/L (ref 39–117)
BUN: 9 mg/dL (ref 6–23)
CO2: 27 mEq/L (ref 19–32)
Calcium: 9.2 mg/dL (ref 8.4–10.5)
Chloride: 105 mEq/L (ref 96–112)
Creatinine, Ser: 1.07 mg/dL (ref 0.4–1.5)
GFR calc Af Amer: >60 mL/min (ref >60)
GFR calc non Af Amer: >60 mL/min (ref >60)
Glucose, Bld: 95 mg/dL (ref 70–99)
Potassium: 3.7 mEq/L (ref 3.5–5.1)
Sodium: 139 mEq/L (ref 135–145)
Total Bilirubin: 0.6 mg/dL (ref 0.3–1.2)
Total Protein: 6.3 g/dL (ref 6.0–8.3)

## 2010-11-30 LAB — DIFFERENTIAL
Basophils Absolute: 0 10*3/uL (ref 0.0–0.1)
Basophils Relative: 0 % (ref 0–1)
Eosinophils Absolute: 0.4 10*3/uL (ref 0.0–0.7)
Eosinophils Relative: 5 % (ref 0–5)
Lymphocytes Relative: 29 % (ref 12–46)
Lymphs Abs: 2.1 10*3/uL (ref 0.7–4.0)
Monocytes Absolute: 0.6 10*3/uL (ref 0.1–1.0)
Monocytes Relative: 9 % (ref 3–12)
Neutro Abs: 4 10*3/uL (ref 1.7–7.7)
Neutrophils Relative %: 57 % (ref 43–77)

## 2010-11-30 LAB — PROTIME-INR
INR: 0.9 (ref 0.00–1.49)
Prothrombin Time: 12.4 seconds (ref 11.6–15.2)

## 2010-11-30 LAB — APTT: aPTT: 31 seconds (ref 24–37)

## 2010-12-05 ENCOUNTER — Emergency Department (HOSPITAL_COMMUNITY): Payer: 59

## 2010-12-05 ENCOUNTER — Emergency Department (HOSPITAL_COMMUNITY)
Admission: EM | Admit: 2010-12-05 | Discharge: 2010-12-05 | Disposition: A | Discharge status: Home / Self Care | Payer: 59 | Attending: Emergency Medicine | Admitting: Emergency Medicine

## 2010-12-05 DIAGNOSIS — F329 Major depressive disorder, single episode, unspecified: Secondary | ICD-10-CM | POA: Insufficient documentation

## 2010-12-05 DIAGNOSIS — R0609 Other forms of dyspnea: Secondary | ICD-10-CM | POA: Insufficient documentation

## 2010-12-05 DIAGNOSIS — J45909 Unspecified asthma, uncomplicated: Secondary | ICD-10-CM | POA: Insufficient documentation

## 2010-12-05 DIAGNOSIS — F411 Generalized anxiety disorder: Secondary | ICD-10-CM | POA: Insufficient documentation

## 2010-12-05 DIAGNOSIS — Z79899 Other long term (current) drug therapy: Secondary | ICD-10-CM | POA: Insufficient documentation

## 2010-12-05 DIAGNOSIS — R0989 Other specified symptoms and signs involving the circulatory and respiratory systems: Secondary | ICD-10-CM | POA: Insufficient documentation

## 2010-12-05 DIAGNOSIS — I10 Essential (primary) hypertension: Secondary | ICD-10-CM | POA: Insufficient documentation

## 2010-12-05 DIAGNOSIS — F3289 Other specified depressive episodes: Secondary | ICD-10-CM | POA: Insufficient documentation

## 2010-12-06 ENCOUNTER — Inpatient Hospital Stay (INDEPENDENT_AMBULATORY_CARE_PROVIDER_SITE_OTHER): Payer: 59 | Admitting: Family Medicine

## 2010-12-06 DIAGNOSIS — J4489 Other specified chronic obstructive pulmonary disease: Secondary | ICD-10-CM

## 2010-12-06 DIAGNOSIS — J449 Chronic obstructive pulmonary disease, unspecified: Secondary | ICD-10-CM

## 2010-12-06 DIAGNOSIS — J45909 Unspecified asthma, uncomplicated: Secondary | ICD-10-CM

## 2011-01-03 NOTE — H&P (Signed)
Evan Bauer, Evan Bauer NO.:  [id number]   MEDICAL RECORD NO.:  [id number]          PATIENT TYPE:  IPS   LOCATION:  0306                          FACILITY:  [facility]   PHYSICIAN:  Jasmine Pang, M.D. DATE OF BIRTH:  11/22/1975   DATE OF ADMISSION:  01/20/2008  DATE OF DISCHARGE:                       PSYCHIATRIC ADMISSION ASSESSMENT   A 35 year old male voluntarily admitted on January 21, 2008.   HISTORY OF PRESENT ILLNESS:  The patient presents with a history of  depression, having suicidal thoughts.  He had left a suicide note for  his wife, and she apparently had found this note and had called the  police.  The patient was sitting in his car, was going to cut himself.  Stated he knew how to cut himself.  He reports having increased mood  swings, feeling very rageful.  States that these symptoms had started  after he was on Chantix for smoking cessation approximately 6 weeks ago.  He does not want to hurt himself.  He states he has no reason to feel  like he does.  He recently bought a home.  He has a wonderful wife and  three children.  He does not feel like himself and again is uncertain  whether he would try to harm himself at this time.   PAST PSYCHIATRIC HISTORY:  First admission to [facility].  He has had no prior psychiatric hospitalizations or any current  outpatient mental health treatment.   SOCIAL HISTORY:  This is a married male with three young children.  He  works for the [employer] for the past 9 years.  Has a twelfth  grade education.   FAMILY HISTORY:  Father with depression.   ALCOHOL AND DRUG HISTORY:  Nonsmoker.  No alcohol or drug use.   Primary care Evan Bauer is Dr. Susann Givens.   MEDICAL PROBLEMS:  Asthma.  Was recently given a prescription for some  Xanax to help control his anxiety.  Albuterol inhaler as needed.   DRUG ALLERGIES:  LEVAQUIN, PENICILLIN, AND CHANTIX.   PHYSICAL EXAMINATION:  This is well-developed  healthy-appearing male who  was fully assessed at [facility].  His physical exam was reviewed.  No  significant findings.  He did receive Ativan.  Temperature was 97, 61  heart rate, 16 respirations, blood pressure is 124/83, 6 feet, 1 inch  tall, 202 pounds.   LABORATORY DATA:  CBC is within normal limits.  Alcohol level less than  5.  Urine drug screen is positive for benzodiazepines.  Acetaminophen  level less than 10.   MENTAL STATUS EXAM:  He is fully alert, cooperative.  Fair eye contact.  Casually dressed.  Speech is clear, normal pace and tone.  The patient's  mood is worthless.  The patient's affect:  He gets very tearful,  anxious, wanting to feel better.  Thought process:  Endorsing suicidal  thoughts, although thoughts are not active.  Denies any homicidal  ideations.  No evidence of any delusional thinking.  Cognitive function  intact.  Memory is good.  Judgment and insight are fair.  He appears  sincere.   AXIS I:  Mood disorder NOS.  AXIS II:  Deferred.  AXIS III:  No known medical conditions.  AXIS IV:  Deferred.  AXIS V:  Current is 35.   PLAN:  He will have Risperdal, will give a dose now.  Will have 0.5 at  bedtime to help control mood and anxiety and will also have Ambien  available for sleep.  We will contact wife for concerns and safety  issues.  The case manager will assess his followup.  He will also be  encouraged to go to the blue team.  His tentative length of stay is 3 to  5 days.      Landry Corporal, N.P.      Jasmine Pang, M.D.  Electronically Signed    JO/MEDQ  D:  01/21/2008  T:  01/21/2008  Job:  [id number]

## 2011-01-03 NOTE — Consult Note (Signed)
Evan Bauer, Evan Bauer NO.:  [id number]   MEDICAL RECORD NO.:  [id number]          PATIENT TYPE:  EMS   LOCATION:  MAJO                         FACILITY:  [facility]   PHYSICIAN:  Leslye Peer, MD    DATE OF BIRTH:  06-06-1976   DATE OF CONSULTATION:  DATE OF DISCHARGE:                                 CONSULTATION   REQUESTING PHYSICIAN:  Robert A. Thurston Hole, MD   REASON FOR CONSULTATION:  Respiratory distress.   HISTORY OF PRESENT ILLNESS:  This is a 35 year old white male patient  who was scheduled to undergo orthopedic procedure today on November 12, 2008.  Apparently, he has a history of asthma, had decided to  discontinue seeing the pulmonary practice approximately 5 years ago  because of the time he wanted to continue smoking.  Apparently, [facility], given IV sedation underwent a scalene  block, and when he awoke, found he could not move his right side, this  triggered extreme panic, followed by shortness of breath, wheezing,  chest discomfort, and reported bronchospasm with chest tightness.  He  was immediately transferred to [facility] Room from the  surgical center, he was given bronchodilators in the form of albuterol.  En route upon time of pulmonary evaluation, the patient was awake,  alert, oriented and in no respiratory distress.  His lungs fields were  clear, with only small residual amount of upper airway wheeze.  We had  been asked to evaluate the patient given his distant history with  pulmonary, and concern for readiness for surgery.   Past medical history includes difficult to treat asthma followed by Dr.  Sandrea Hughs, last seen approximately 5 years ago.  It was noted at that  point that his dyspnea symptoms were out of proportion to both  structural lung findings and pulmonary function testing consistent with  question of vocal cord dysfunction/upper airway instability.  Of note,  Evan Bauer does not have  long-acting beta-agonist at this time nor  inhaled corticosteroids, on a typical day, he uses short-acting beta-  agonist approximately every 2 hours for symptoms of dyspnea.  He also  has a history of anxiety and depression, and as well as an active  smoker, seasonal allergies, and gastroesophageal reflux disease.   SOCIAL HISTORY:  Currently disabled, is an active smoker.   FAMILY HISTORY:  Not applicable.   CURRENT MEDICATIONS:  1. Abilify 5 mg.  2. Remeron 30.  3. Nexium 40 mg.  4. Provigil 200.  5. Zyrtec 10.  6. Albuterol MDI p.r.n.   REVIEW OF SYSTEMS:  Per HPI.   PHYSICAL EXAMINATION:  VITAL SIGNS:  Heart rate 106, blood pressure  135/91, respirations 26, saturation 100%, temperature pending.  GENERAL:  This is a well-developed, anxious 35 year old male patient,  currently in no acute distress.  HEENT:  Normocephalic.  No JVD.  No adenopathy.  There is a faint upper  airway wheeze; however, the pulmonary fields are clear to auscultation.  CARDIAC:  Regular rate and rhythm.  EXTREMITIES:  Without edema.  ABDOMEN:  Soft, nontender.  He does have right-sided decreased strength  following scaling block.  NEUROLOGIC:  Grossly intact and anxious.   IMPRESSION AND PLAN:  Probable vocal cord dysfunction with acute episode  of exacerbation.  Plus/minus to airflow limitations/reactive airway  disease.  As noted above, he does use his short-acting beta-agonist  approximately every 2 hours at baseline.  Therefore, plan at this point  will be to reinitiate more long-acting baseline regimen of inhaled  corticosteroids and long-acting beta-agonist.  For this, we will start  him on Symbicort q. week limit, dry power exposure which hopefully will  also decrease risk of upper airway irritation.  Additionally, we would  recommend him to continue current Nexium which he says he gets good  relief from his reflux for and then finally we will add Nasonex b.i.d.  to help with symptoms of  seasonal allergy, and more recent postnasal  drip which may also be exacerbating upper airway irritation and finally  we have scheduled him to see Dr. Kalman Shan on December 02, 2008 at  10 a.m., at which time, we can further evaluate the true extensiveness  of his airflow limitations, and hopefully be reevaluate for readiness  for orthopedic surgery.  Additionally, of course, we have counseled Mr.  Bauer in the importance of smoking cessation.      Evan Resides, NP      Leslye Peer, MD  Electronically Signed    PB/MEDQ  D:  11/11/2008  T:  11/12/2008  Job:  [id number]

## 2011-01-03 NOTE — Discharge Summary (Signed)
Evan Bauer, Evan Bauer             ACCOUNT NO.:  [id number]   MEDICAL RECORD NO.:  [id number]          PATIENT TYPE:  IPS   LOCATION:  0306                          [facility]  [facility]   PHYSICIAN:  Jasmine Pang, M.D. DATE OF BIRTH:  06-Mar-1976   DATE OF ADMISSION:  01/20/2008  DATE OF DISCHARGE:  01/22/2008                               DISCHARGE SUMMARY   IDENTIFYING INFORMATION:  This is a 35 year old married white male who  was admitted on a voluntary basis on January 20, 2008.   HISTORY OF PRESENT ILLNESS:  The patient has a history of depression  with suicidal ideations.  He had increased mood swings and has been  states he has been rageful.  He states this started when he began  Chantix approximately 6 weeks ago.  He states he left a suicide note,  but does not remember doing this.  He states he was going to cut  himself.  His wife called the police because she was afraid he would  harm himself.  He states he has no reason to feel this way, though he  does have some job stress.  He believes that it was the Chantix that has  caused the change in his mood.   PAST PSYCHIATRIC HISTORY:  This is the first [facility] admission for the  patient.  He has had no prior admissions.   FAMILY HISTORY:  Father has depression.   SUBSTANCE ABUSE:  Nonsmoker.  No alcohol or drug use.   MEDICAL PROBLEMS:  Asthma.   MEDICATIONS:  Xanax and albuterol.   DRUG ALLERGIES:  LEVAQUIN, PENICILLIN, and CHANTIX.   PHYSICAL FINDINGS:  There were no acute physical or medical problems  noted on exam. A complete physical exam was done at [facility].   DIAGNOSTIC STUDIES:  CBC was within normal limits.  Alcohol level was  less than 5.  UDS was positive for benzodiazepines (the patient states  his wife gave him a Xanax to try to help calm him down).  Acetaminophen  was less than 10.   HOSPITAL COURSE:  Upon admission, the patient was started on his  albuterol inhaler 2 puffs q.4 h. p.r.n., Librium  detox protocol, and  trazodone 50 mg p.o. q.h.s. p.r.n. insomnia.  On January 21, 2008, due to  his history of mood swings, he was started on Risperdal 0.25 mg now,  then 0.5 mg p.o. q.h.s.  He was also started on Risperdal M-Tab 0.5 mg  p.o. q.6 h. p.r.n. agitation. The patient in individual sessions with  me, was friendly and cooperative.  He states he does have mood swings.  They worsened after starting the Chantix.  He states he has had a job  with the [employer] for 9 years and this was rather stressful.  He went to  [facility] days ago and they released him.  He is married  with 3 children.  No drugs or alcohol use.  Positive family history of  depression.  On January 22, 2008, mental status had improved markedly from  admission status.  The patient was less depressed and less  anxious. His  sleep was good.  Appetite was good.  His mood was stabilized.  He was  euthymic feel wonderful.  Affect was wide range.  There was no  suicidal or homicidal ideation.  No auditory or visual hallucinations.  No thoughts of self-injurious behavior.  No paranoia or delusions.  Thoughts were logical and goal-directed.  Thought content, no  predominant theme.  Cognitive was grossly back to baseline.  It was felt  the patient would be safe to go home today.  His wife felt he was ready  for discharge.  He was tolerating the Risperdal well.  He was wanting to  go back to work next week.   DISCHARGE DIAGNOSES:  Axis I:  Mood disorder, not otherwise specified.  Axis II:  None.  Axis III:  None.  Axis IV:  Moderate ( occupational problems, some problems with primary  support group).  Axis V:  Global assessment of functioning was 50 upon discharge.  GAF  was 35 upon admission.  GAF highest past year was 75-80.   DISCHARGE PLANS:  There was no specific activity level or dietary  restrictions.   POSTHOSPITAL CARE PLANS:  The patient will see Dr. Lolly Mustache on March 17, 2008, at 12 p.m. at the [facility] in  [place].  He will also see Dr. Kieth Brightly, a  psychologist on January 30, 2008, at 9 a.m. at the [facility].   DISCHARGE MEDICATIONS:  1. Risperdal 0.5 mg at bedtime.  2. Albuterol inhaler 2 puffs p.o. q.6 h. p.r.n.      Jasmine Pang, M.D.  Electronically Signed     BHS/MEDQ  D:  01/22/2008  T:  01/22/2008  Job:  [id number]

## 2011-01-03 NOTE — Op Note (Signed)
Evan Bauer, Evan Bauer             ACCOUNT NO.:  [id number]   MEDICAL RECORD NO.:  [id number]          PATIENT TYPE:  INP   LOCATION:  5019                         FACILITY:  [facility]   PHYSICIAN:  Elana Alm. Thurston Hole, M.D. DATE OF BIRTH:  Jun 08, 1976   DATE OF PROCEDURE:  12/09/2008  DATE OF DISCHARGE:                               OPERATIVE REPORT   PREOPERATIVE DIAGNOSES:  1. Right shoulder superior labrum anterior and posterior tear.  2. Right shoulder possible loose body.  3. Right shoulder impingement.   POSTOPERATIVE DIAGNOSES:  1. Right shoulder superior labrum anterior and posterior tear.  2. Right shoulder possible loose body.  3. Right shoulder impingement.   PROCEDURE:  1. Right shoulder examination under anesthesia followed by      arthroscopically assisted superior labrum anterior and posterior      repair using Arthrex push lock anchors x3.  2. Right shoulder loose body excision with inferior labral      debridement.  3. Right shoulder subacromial decompression.   SURGEON:  Elana Alm. Thurston Hole, MD   ASSISTANT:  Julien Girt, PA   ANESTHESIA:  General.   OPERATIVE TIME:  One hour.   COMPLICATIONS:  None.   INDICATIONS FOR PROCEDURE:  Evan Bauer is a 35 year old gentleman who  injured his right shoulder at work on September 24, 2008, when he slipped  on ice.  He has had persistent significant pain with exam and MRI  documenting a superior labrum tear, possible loose bodies with  impingement.  He has failed conservative care and is now to undergo  arthroscopy.   DESCRIPTION:  Evan Bauer was brought to the operating room on December 09, 2008, placed on operative table in supine position.  After an adequate  level of general anesthesia was obtained, his right shoulder was  examined.  He had full range of motion in his shoulder with stable  ligamentous exam.  He was then placed in beach-chair position and  shoulder arm was prepped using sterile DuraPrep and draped  using sterile  technique.  He received Ancef 1 g IV preoperatively for prophylaxis.  Initially, through a posterior arthroscopic portal, the arthroscope with  a pump attachment was placed into an anterior portal and arthroscopic  probe was placed.  On initial inspection, the articular cartilage and  the glenohumeral joint was intact.  The anterior superior and posterior  superior labrum were completely torn and detached from their superior  glenoid attachment with an SLAP tear from the 10 o'clock position  posterior superiorly all the way to the 2 o'clock position anterior  superiorly.  The anterior inferior labrum and the inferior labrum showed  fraying with some loose pieces of labral tissue and this was debrided,  but the anterior inferior glenohumeral ligament complex and labral  complex was otherwise well attached and intact, and was not unstable.  Biceps tendon itself was intact.  The rotator cuff was thoroughly  inspected and this was found to be intact.  The inferior capsular recess  free of pathology.  At this point, an accessory lateral trans rotator  cuff portal was made so that  the superior labral tear could be repaired.  Using combination of debriders and a small bur, the superior glenoid  from the 10 o'clock to the 2 o'clock position was decorticated, so that  there would be good bleeding bone in this area for good healing.  A  posterior superior repair was carried out with a mattress suture and a  push lock anchor in the 11 o'clock position.  A second push lock anchor  and mattress suture were placed in the 1 o'clock position and a third  mattress suture and push lock anchor placed in the 2-3 o'clock position  on the anterior superior glenoid rim.  This completely repaired and  completely restored normal anatomy to the superior labral construct.  At  this point, the shoulder could be brought through a full range of motion  with no excessive tension on the repair.  At this  point, the subacromial  space was entered.  Moderately thickened bursitis was resected.  The  rotator cuff was somewhat inflamed on the bursal surface, but no tearing  was noted.  Impingement was noted and a subacromial decompression was  carried out removing 6-8 mm of the undersurface of the anterior,  anterolateral, anteromedial acromion, and CA ligament release carried  out as well.  The [facility] joint was not pathologic and thus was not  disturbed.  At this point, it is felt that all pathology had been  satisfactorily addressed.  The instruments were removed.  Portals were  closed with 3-0 nylon suture.  Sterile dressings and abduction sling  applied and the patient awakened, extubated, and taken to recovery room  in stable condition.  Needle and sponge count was correct x2 at the end  of the case.   FOLLOW UP CARE:  Evan Bauer will be followed overnight for observation.  He will be discharged tomorrow if stable.  He will be seen back in the  office in a week for sutures out and follow up.      Robert A. Thurston Hole, M.D.  Electronically Signed     RAW/MEDQ  D:  12/09/2008  T:  12/10/2008  Job:  [id number]   cc:   [employer]/support/administrative officer

## 2011-01-04 ENCOUNTER — Telehealth: Payer: Self-pay | Admitting: Family Medicine

## 2011-01-06 NOTE — Discharge Summary (Signed)
NAMEYOVANNY, COATS             ACCOUNT NO.:  [id number]   MEDICAL RECORD NO.:  [id number]          PATIENT TYPE:  INP   LOCATION:  0358                         FACILITY:  [facility]   PHYSICIAN:  Charlaine Dalton. Sherene Sires, M.D. [facility] OF BIRTH:  1975-10-20   DATE OF ADMISSION:  05/27/2004  DATE OF DISCHARGE:                                 DISCHARGE SUMMARY   FINAL DIAGNOSES:  1.  Status asthmaticus secondary to active smoking.  2.  Bilateral lower lobe subsegmental atelectasis secondary to acquired      mucociliary dysfunction secondary to smoking.  3.  Hypertension at least partially secondary to anxiety.  4.  Severe rhinitis clinically with evidence of sinusitis by CT scan this      admission.   HISTORY:  Please see H&P.  This is a 35 year old white male, long-time  smoker, who reports chronic allergies, treated himself with Benadryl,  albuterol, and Allegra D when we first saw him in the office on October 7,  in apparent status asthmaticus.  He was admitted directly to the ICU and  actually required BiPAP as well as high dose steroids, magnesium sulfate,  and around-the-clock nebulizers to stabilize him.  His chest x-ray suggested  basilar atelectasis more than pneumonia but a sinus CT scan did show  sinusitis.  He was treated with topical steroids for his nose along with  Afrin for 5 days and will be discharged on a complex medical regimen that  was reviewed with him and his wife in detail.   DISCHARGE MEDICATIONS:  1.  Advair __________ 1 b.i.d.  2.  Avelox 20 mg daily x 5 more days to total 10 days of therapy.  3.  Clonidine 0.1 mg 1 b.i.d.  4.  Nasonex 2 puffs each nostril b.i.d.  5.  Singulair 10 mg q.p.m.  6.  Protonix 40 mg q.a.m. before breakfast.  7.  Wellbutrin 150 mg daily.  8.  Prednisone 20 mg 2 b.i.d. x 3 days and then 2 q.a.m. until seen in the      office next week (within the next 5 days).  9.  Spiriva cap 1 daily for cough.  10. He can use Mucinex DM 2 b.i.d.  over-the-counter for wheezing with      shortness of breath.  11. He can use albuterol q.24h.  12. For nerves or sleeping, use alprazolam 0.5 mg q.4h.  He has been trained      on flutter valve.   He continues to be short of breath with minimal exertion but does not  desaturate.  He can make it approximately 50 feet but did not desaturate.  This represents a marked improvement over admission where he was short of  breath at rest.  He was therefore instructed not to work until seen in the  office to see to what extent his airways improve further with outpatient  management.  He was also strongly cautioned that smoking or not taking his  medicines exactly as they are written is very high risk for landing him back  in the emergency room or the ICU.  He was told point  blank that he was at  high risk of death from asthma.   Follow up will be within 5 days in the office.  He will need a follow up  chest x-ray and then tapering of his medications depending on his progress.      MBW/MEDQ  D:  06/02/2004  T:  06/02/2004  Job:  [id number]

## 2011-01-06 NOTE — Op Note (Signed)
[facility]  Patient:    Evan Bauer, Evan Bauer                    MRN: [id number] Proc. Date: 08/06/00 Adm. Date:  [id number] Attending:  Louie Bun CC:         Ammie Dalton, M.D.   Operative Report  PROCEDURE:  Esophagogastroduodenoscopy.  INDICATION FOR PROCEDURE:  Nausea, vomiting, abdominal pain, and black stool suggesting upper GI bleeding.  DESCRIPTION OF PROCEDURE: The patient was placed in the left lateral decubitus position and placed on the pulse monitor with continuous low-flow oxygen delivered by nasal cannula.  He was sedated with 60 mg IV Demerol and 6 mg IV Versed.  The Olympus video endoscope was advanced under direct vision into the oropharynx and esophagus.  The esophagus was straight and of normal caliber with the squamocolumnar line at 38 cm.  There appeared to be a 2 cm sliding hiatal hernia distal to the Z-line.  There also appeared to be 1-2 discrete erosions extending 1-2 cm from the Z-line with no stigma of hemorrhage.  I did not discern any Mallory-Weiss tear, varices, esophageal ulcer, or other potential bleeding lesion of the distal esophagus or GE junction.  The stomach was entered, and there was a moderate amount of semisolid food in the dependent portions of the fundus.  The patient was known to have eaten 1/2 a chicken sandwich about five hours prior to this procedure.  This limited somewhat the view of the proximal stomach, but there were no visible coffee grounds, blood, or bloody fluid anywhere in the stomach.  The visualized portions of the proximal stomach appeared normal as did the body and antrum which were well visualized.  The pylorus was nondeformed and easily allowed passage of the endoscope tip into the duodenum.  Both bulb and second portion were well inspected and appeared to be within normal limits.  The endoscope was then withdrawn, and the patient returned to the recovery room in  stable condition.  He tolerated the procedure well, and there were no immediate complications.  IMPRESSION:  Moderate erosive esophagitis with no stigma of hemorrhage.  No suggestion of active bleeding proximal to the distal duodenum.  PLAN:  Await lab work to determine whether observation admission is warranted to observe for ongoing GI bleeding and whether lower GI evaluation is needed. DD:  08/06/00 TD:  08/07/00 Job: [id number]

## 2011-01-06 NOTE — Consult Note (Signed)
[facility]  Patient:    Evan Bauer, Evan Bauer                    MRN: [id number] Proc. Date: 08/06/00 Adm. Date:  [id number] Attending:  Louie Bun CC:         Ammie Dalton, M.D.   Consultation Report  REASON FOR CONSULTATION:  Nausea, vomiting, abdominal pain and heme-positive stool.  HISTORY OF ILLNESS:  Patient is a 35 year old white male who was in previously good health until he began experiencing a headache and malaise Saturday night, followed by the development of periumbilical abdominal pain Sunday morning. He noted that the pain tended to be worse when he was sitting or standing and better when lying down.  He vomited once yesterday with the appearance of partially digested food and no coffee-grounds.  He does not recall having a bowel movement yesterday.  He went to work early in the day and after he vomited, came home and stayed in bed the remainder of the day and night.  This morning when he awoke, he began having abdominal pain again and had a total of four bowel movements which are described as black and somewhat tarry, without diarrhea or bright red blood per rectum.  He was confirmed to be heme-positive, with a melenic appearance to his stool, by Dr. Ammie Dalton and referred here.  Vital signs are stable and CBC and BMET are pending at the time of this dictation.  Patient denies any history of GI bleeding, peptic ulcer disease or use of nonsteroidal anti-inflammatory drugs.  PAST MEDICAL HISTORY:  Essentially unremarkable.  PAST SURGICAL HISTORY:  None.  HOSPITALIZATIONS:  None.  ALLERGIES:  PENICILLIN.  FAMILY HISTORY:  Father has Alzheimers disease.  No family history of peptic ulcer disease.  SOCIAL HISTORY:  Patient is married.  He has two jobs.  His wife is expecting their first child.  He admits to cigarette smoking and denies alcohol use.  PHYSICAL EXAMINATION  GENERAL:  Well-developed, well-nourished  white male in no acute distress.  HEENT:  Unremarkable.  HEART:  Regular rate and rhythm without murmur.  LUNGS:  Clear.  ABDOMEN:  Soft, nondistended, with normoactive bowel sounds.  No hepatosplenomegaly, mass or guarding.  There is mild periumbilical abdominal tenderness.  RECTAL:  Exam was not repeated.  LABORATORY DATA:  Pending.  IMPRESSION:  Nausea, vomiting, abdominal pain and black stools suggestive of an upper gastrointestinal bleed.  PLAN:  Will proceed with esophagogastroduodenoscopy.  Will await CBC and BMET as well. DD:  08/06/00 TD:  08/07/00 Job: [id number]

## 2011-01-27 ENCOUNTER — Other Ambulatory Visit: Payer: Self-pay | Admitting: Family Medicine

## 2011-02-03 NOTE — Telephone Encounter (Signed)
DONE

## 2011-03-07 ENCOUNTER — Ambulatory Visit
Admission: RE | Admit: 2011-03-07 | Discharge: 2011-03-07 | Disposition: A | Discharge status: Home / Self Care | Payer: PRIVATE HEALTH INSURANCE | Source: Ambulatory Visit | Attending: Occupational Medicine | Admitting: Occupational Medicine

## 2011-03-07 ENCOUNTER — Other Ambulatory Visit: Payer: Self-pay | Admitting: Occupational Medicine

## 2011-03-07 DIAGNOSIS — R52 Pain, unspecified: Secondary | ICD-10-CM

## 2011-03-07 DIAGNOSIS — T1490XA Injury, unspecified, initial encounter: Secondary | ICD-10-CM

## 2011-04-21 ENCOUNTER — Ambulatory Visit (INDEPENDENT_AMBULATORY_CARE_PROVIDER_SITE_OTHER): Payer: Self-pay | Admitting: Medical

## 2011-04-21 ENCOUNTER — Encounter: Payer: Self-pay | Admitting: Medical

## 2011-04-21 VITALS — BP 102/82 | HR 68 | Temp 98.3°F | Resp 20 | Ht 73.0 in | Wt 220.0 lb

## 2011-04-21 DIAGNOSIS — L0390 Cellulitis, unspecified: Secondary | ICD-10-CM

## 2011-04-21 DIAGNOSIS — L0291 Cutaneous abscess, unspecified: Secondary | ICD-10-CM

## 2011-04-21 DIAGNOSIS — Z7729 Contact with and (suspected ) exposure to other hazardous substances: Secondary | ICD-10-CM

## 2011-04-21 MED ORDER — HYDROXYZINE HCL 25 MG PO TABS: ORAL_TABLET | ORAL | Status: DC

## 2011-04-21 MED ORDER — DOXYCYCLINE HYCLATE 100 MG PO TABS: 100.0000 mg | ORAL_TABLET | Freq: Two times a day (BID) | ORAL | Status: AC

## 2011-04-21 MED ORDER — MUPIROCIN 2 % EX OINT: TOPICAL_OINTMENT | CUTANEOUS | Status: DC

## 2011-04-21 NOTE — Progress Notes (Signed)
Subjective:   HPI [name] is a 35 y.o. male who presents for skin concern and swelling. He notes that he works for city of [employer], and works in close contact with sewage.  He wears [employer] suit and at times is chest deep in sewage.  He also has to crawl in drains where he frequently encounters spiders such as black widows and brown recluse.  He notes 1-2 mo had bite or round wound that came up on right posterior leg.  It was red, had a hole and pus came out.  It gradually improved, but then this week it turned red again, drained some pus, and the area is swollen.  He notes mild pain.  Denies injury or trauma, but not sure about a bite.  Used nothing on the area.  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  He does note hx/o MRSA infection, recurrent boils, in remote past.  No other aggravating or relieving factors.  No other c/o.  The following portions of the patient's history were reviewed and updated as appropriate: allergies, current medications, past family history, past medical history, past social history, past surgical history and problem list.  Past Medical History  Diagnosis Date  . Asthma   . Allergy   . GERD (gastroesophageal reflux disease)   . History of MRSA infection    Review of Systems Gen: no fever, chills, sweats, wt loss HEENT: no ST, sinus pressure Lungs: no SOB, cough Herat: no CP, palpations GI: no pain, NVD MSK: no joint pain, swelling GU: no dysuria     Objective:   Physical Exam  General appearence: alert, no distress, WD/WN, white male Skin: right posteromedial lower leg, just inferior to lower gastrocnemius with mild swelling, round 3cm patch of erythema, crusting, slight oozing of serous fluid, but no induration or fluctuance Ext: otherwise no edema, cyanosis, of clubbing Pulse: normal pedal pulses Neuro: normal muscle tone, mass, and no obvious deformity otherwise     Assessment :    Encounter  Diagnoses  Name Primary?  . Cellulitis Yes  . Hazardous substance suspected exposure      Plan:    Advised Doxycyline oral, Mupirocin topical, and Hydroxyzine for itch and rash.   Likely etiology is bite, possibly brown recluse vs cellulitis vs other dermatitis. Keep area clean and dry.   RTC for recheck in 10 days, sooner prn.

## 2011-05-01 ENCOUNTER — Encounter: Payer: Self-pay | Admitting: Family Medicine

## 2011-05-03 ENCOUNTER — Ambulatory Visit: Payer: Self-pay | Admitting: Medical

## 2011-05-18 LAB — URINALYSIS, ROUTINE W REFLEX MICROSCOPIC
Bilirubin Urine: NEGATIVE
Ketones, ur: NEGATIVE
Nitrite: NEGATIVE
Protein, ur: NEGATIVE
Urobilinogen, UA: 0.2

## 2011-05-18 LAB — CBC
HCT: 46.9
Hemoglobin: 16.4
MCHC: 35
RBC: 5.24
RDW: 12.5
WBC: 6.3

## 2011-05-18 LAB — DIFFERENTIAL
Basophils Absolute: 0
Basophils Relative: 0
Eosinophils Absolute: 0.2
Eosinophils Relative: 3
Monocytes Absolute: 0.5

## 2011-05-18 LAB — ACETAMINOPHEN LEVEL: Acetaminophen (Tylenol), Serum: <10 — ABNORMAL LOW

## 2011-05-18 LAB — RAPID URINE DRUG SCREEN, HOSP PERFORMED
Cocaine: NOT DETECTED
Tetrahydrocannabinol: NOT DETECTED

## 2011-05-18 LAB — BASIC METABOLIC PANEL
Calcium: 9.2
Chloride: 104
Creatinine, Ser: 1.02
GFR calc Af Amer: >60
GFR calc non Af Amer: >60

## 2011-05-18 LAB — ETHANOL: Alcohol, Ethyl (B): <5

## 2011-05-26 LAB — RAPID URINE DRUG SCREEN, HOSP PERFORMED
Cocaine: NOT DETECTED
Opiates: POSITIVE — AB

## 2011-05-26 LAB — CBC
HCT: 47.9 % (ref 39.0–52.0)
MCV: 90.7 fL (ref 78.0–100.0)
Platelets: 274 10*3/uL (ref 150–400)
WBC: 8.9 10*3/uL (ref 4.0–10.5)

## 2011-05-26 LAB — DIFFERENTIAL
Eosinophils Absolute: 0.6 10*3/uL (ref 0.0–0.7)
Eosinophils Relative: 7 % — ABNORMAL HIGH (ref 0–5)
Lymphs Abs: 2.6 10*3/uL (ref 0.7–4.0)
Monocytes Relative: 9 % (ref 3–12)

## 2011-05-26 LAB — BASIC METABOLIC PANEL
BUN: 10 mg/dL (ref 6–23)
Chloride: 104 mEq/L (ref 96–112)
Potassium: 3.6 mEq/L (ref 3.5–5.1)

## 2011-05-26 LAB — ETHANOL: Alcohol, Ethyl (B): <5 mg/dL (ref 0–10)

## 2011-07-07 ENCOUNTER — Other Ambulatory Visit: Payer: Self-pay | Admitting: Family Medicine

## 2011-07-07 NOTE — Telephone Encounter (Signed)
Is this okay to refill? 

## 2011-07-07 NOTE — Telephone Encounter (Signed)
His medication was renewed but he needs an appointment to followup on this

## 2011-07-07 NOTE — Telephone Encounter (Signed)
Left message for his wife that his inhaler was refilled and for him to call and set up an appt for a follow-up

## 2011-09-26 ENCOUNTER — Ambulatory Visit (INDEPENDENT_AMBULATORY_CARE_PROVIDER_SITE_OTHER): Payer: 59 | Admitting: Medical

## 2011-09-26 ENCOUNTER — Encounter: Payer: Self-pay | Admitting: Medical

## 2011-09-26 DIAGNOSIS — J45909 Unspecified asthma, uncomplicated: Secondary | ICD-10-CM | POA: Insufficient documentation

## 2011-09-26 DIAGNOSIS — J111 Influenza due to unidentified influenza virus with other respiratory manifestations: Secondary | ICD-10-CM | POA: Insufficient documentation

## 2011-09-26 MED ORDER — ALBUTEROL SULFATE HFA 108 (90 BASE) MCG/ACT IN AERS: 2.0000 | INHALATION_SPRAY | Freq: Four times a day (QID) | RESPIRATORY_TRACT | Status: DC | PRN

## 2011-09-26 NOTE — Progress Notes (Signed)
Subjective:  Evan Bauer is a 36 y.o. male who presents for 4 day hx/o illness.  He reports symptoms began this past weekend with headache, body aches, chills, cough, sore throat, several episodes of nausea and vomiting, chest congestion, and mild wheezing.  Was worse on Sunday, but a little better the last few days.  His daughter was sick with the flu last week but she was only sick 1 day.  He is an asthmatic, and has had to use his inhaler several times the last few days.   He missed 2 days of work, yesterday and today already for the illness.  Has had fever to 102, been in bed all weekend.  He is here today as he is not improving.    Past Medical History  Diagnosis Date  . Asthma   . GERD (gastroesophageal reflux disease)   . History of MRSA infection   . Smoker   . Allergy     RHINITIS   ROS:     Objective:      General: Ill-appearing, well-developed, well-nourished Skin: warm, moist HEENT: Nose inflamed and congested, clear conjunctiva, TMs pearly, no sinus tenderness, pharynx with erythema, no exudates Neck: Supple, nontender, shotty cervical adenopathy Heart: Regular rate and rhythm, normal S1, S2, no murmurs Lungs: Clear to auscultation bilaterally, no wheezes, rales, rhonchi Abdomen: Nontender non distended Extremities: Mild generalized tenderness      Assessment and Plan:   Encounter Diagnoses  Name Primary?  . Influenza Yes  . Asthma    At this point he is probably on the tail end of the flu. We discussed usual course of illness for influenza, possible complications, his current exam findings, and advise that with rest, good hydration, over-the-counter analgesia, that symptoms should resolve in a few more days.  Discussed means of prevention and contagious nature of this.  Gave note for work.  Refilled his albuterol inhaler. Advised that if he worsens in terms of difficulty breathing, wheezing, fever, or worsening chest congestion to call or return right away.

## 2011-09-26 NOTE — Patient Instructions (Signed)
  Ibuprofen 200mg  OTC, 3-4 tablets every 6 hours as needed for aches/pains/fever.  REST.   Influenza, Adult Influenza ("the flu") is a viral infection of the respiratory tract. It causes chills, fever, cough, headache, body aches, and sore throat. Influenza in general will make you feel sicker than when you have a common cold. Symptoms of the illness typically last a few days. Cough and fatigue may continue for as long as 7 to 10 days. Influenza is highly contagious. It spreads easily to others in the droplets from coughs and sneezes. People frequently become infected by touching something that was recently contaminated with the virus and then touch their mouth, nose or eyes. This infection is caused by a virus. Symptoms will not be reduced or improved by taking an antibiotic. Antibiotics are medications that kill bacteria, not viruses. DIAGNOSIS  Diagnosis of influenza is often made based on the history and physical examination as well as the presence of influenza reports occurring in your community. Testing can be done if the diagnosis is not certain. TREATMENT  Since influenza is caused by a virus, antibiotics are not helpful. Your caregiver may prescribe antiviral medicines to shorten the illness and lessen the severity. Your caregiver may also recommend influenza vaccination and/or antiviral medicines for your family members in order to prevent the spread of influenza to them. HOME CARE INSTRUCTIONS  DO NOT GIVE ASPIRIN TO PERSONS WITH INFLUENZA WHO ARE UNDER 59 YEARS OF AGE. This could lead to brain and liver damage (Reye's syndrome). Read the label on over-the-counter medicines.   Stay home from work or school if at all possible until most of your symptoms are gone.   Only take over-the-counter or prescription medicines for pain, discomfort, or fever as directed by your caregiver.   Use a cool mist humidifier to increase air moisture. This will make breathing easier.   Rest until your  temperature is nearly normal: 98.6 F (37 C). This usually takes 3 to 4 days. Be sure you get plenty of rest.   Drink at least eight, eight-ounce glasses of fluids per day. Fluids include water, juice, broth, gelatin, or lemonade.   Cover your mouth and nose when coughing or sneezing and wash your hands often to prevent the spread of this virus to other persons.  PREVENTION  Annual influenza vaccination (flu shots) is the best way to avoid getting influenza. An annual flu shot is now routinely recommended for all adults in the [place] SEEK MEDICAL CARE IF:   You develop shortness of breath while resting.   You have a deep cough with production of mucous or chest pain.   You develop nausea (feeling sick to your stomach), vomiting, or diarrhea.  SEEK IMMEDIATE MEDICAL CARE IF:   You have difficulty breathing, become short of breath, or your skin or nails turn bluish.   You develop severe neck pain or stiffness.   You develop a severe headache, facial pain, or earache.   You have a fever.   You develop nausea or vomiting that cannot be controlled.  Document Released: 08/04/2000 Document Revised: 04/19/2011 Document Reviewed: 06/09/2009 [facility] Patient Information 2012 [place], [place].

## 2012-01-08 ENCOUNTER — Other Ambulatory Visit: Payer: Self-pay | Admitting: Medical

## 2012-01-08 ENCOUNTER — Telehealth: Payer: Self-pay | Admitting: Medical

## 2012-01-08 DIAGNOSIS — K219 Gastro-esophageal reflux disease without esophagitis: Secondary | ICD-10-CM

## 2012-01-08 MED ORDER — PANTOPRAZOLE SODIUM 40 MG PO TBEC: 40.0000 mg | DELAYED_RELEASE_TABLET | Freq: Two times a day (BID) | ORAL | Status: DC

## 2012-01-08 MED ORDER — ALBUTEROL SULFATE HFA 108 (90 BASE) MCG/ACT IN AERS: 2.0000 | INHALATION_SPRAY | Freq: Four times a day (QID) | RESPIRATORY_TRACT | Status: AC | PRN

## 2012-01-08 NOTE — Telephone Encounter (Signed)
lmom notifying the patient that the medications was sent to pharmacy but he needs to callback for an office visit. CLS

## 2012-01-08 NOTE — Telephone Encounter (Signed)
Refill sent, needs OV/general med check

## 2012-01-08 NOTE — Telephone Encounter (Signed)
Pt changing Pharmacies  To [facility]  [phone]  Needs refill on Ventolin inhaler and on Protonix  1 BID #[id number]

## 2012-07-26 ENCOUNTER — Encounter: Payer: Self-pay | Admitting: Medical

## 2012-07-26 ENCOUNTER — Other Ambulatory Visit: Payer: Self-pay | Admitting: Medical

## 2012-07-26 ENCOUNTER — Telehealth: Payer: Self-pay | Admitting: Medical

## 2012-07-26 ENCOUNTER — Ambulatory Visit (INDEPENDENT_AMBULATORY_CARE_PROVIDER_SITE_OTHER): Payer: 59 | Admitting: Medical

## 2012-07-26 VITALS — BP 120/80 | HR 82 | Temp 98.4°F | Resp 16 | Wt 206.0 lb

## 2012-07-26 DIAGNOSIS — Q809 Congenital ichthyosis, unspecified: Secondary | ICD-10-CM

## 2012-07-26 DIAGNOSIS — L089 Local infection of the skin and subcutaneous tissue, unspecified: Secondary | ICD-10-CM

## 2012-07-26 DIAGNOSIS — K219 Gastro-esophageal reflux disease without esophagitis: Secondary | ICD-10-CM

## 2012-07-26 DIAGNOSIS — R21 Rash and other nonspecific skin eruption: Secondary | ICD-10-CM

## 2012-07-26 DIAGNOSIS — B958 Unspecified staphylococcus as the cause of diseases classified elsewhere: Secondary | ICD-10-CM

## 2012-07-26 DIAGNOSIS — Q828 Other specified congenital malformations of skin: Secondary | ICD-10-CM

## 2012-07-26 MED ORDER — DOXYCYCLINE HYCLATE 100 MG PO TABS: 100.0000 mg | ORAL_TABLET | Freq: Two times a day (BID) | ORAL | Status: DC

## 2012-07-26 MED ORDER — TRIAMCINOLONE ACETONIDE 0.1 % EX CREA: TOPICAL_CREAM | Freq: Two times a day (BID) | CUTANEOUS | Status: DC

## 2012-07-26 NOTE — Addendum Note (Signed)
Addended by: Janeice Robinson on: 07/26/2012 02:26 PM   Modules accepted: Orders

## 2012-07-26 NOTE — Progress Notes (Signed)
Subjective: Here for multiple c/o.  Skin - boils and bumps on left forearm, upper arm, fingers been a recurring problem the last 2 months.  He has hx/o MRSA in the past, wonders if this is MRSA.  He has dry skin in general.   He works in [employer], thus has exposure risks.  The left upper arm boil is the main one that concerns him.     He has dry skin in general, uses Aveeno lotion.  He does report taking hot showers.  Has seen dermatology, Dr. Margo Aye in [place] prior.  Has foot rash, itching.  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   feets get sweaty and wet daily from begin in closed boots.  Has rash on feet he has been using antifungal creams for the past 90mo without improvement.   Needs med refill on Protonix.  Has long hx/o GERD, uses Protnoinx BID.   He does like his spicy foods.  Had EGD 13 years ago.   Denies hx/o GI cancer in family.  Past Medical History  Diagnosis Date  . Asthma   . GERD (gastroesophageal reflux disease)   . History of MRSA infection   . Smoker   . Allergy     RHINITIS   ROS as in HPI  Objective: Gen: wd, wn, nad Skin: in general skin particularly back is flaky and drive, he has several small crusting wounds in various stages, all seem furuncle like although none are indurated with erythema or fluctuance, most are either resolving or mostly resolved.   Left upper lateral arm with slightly raised round mildly erythematous 1cm lesion most likely a resolving furuncle.  Left distal foot and 5th toe with well defined patch of thickened and pink/redness, no obvious scales, similar patch of redness on dorsal anterior foot, great toe with similar redness but it is more paronychial, and base of nail seems to be somewhat eroded GI: nontender   Assessment: Encounter Diagnoses  Name Primary?  . Staph skin infection Yes  . GERD (gastroesophageal reflux disease)   . Xeroderma   . Rash     Plan: Staph infection - swab taken and sent for culture.  Begin doxycycline  GERD - trial of Dexilant, needs to work on diet measures to avoid triggers, need to stop tobacco  Xeroderma - avoid hot showers, c/t daily moisturizing lotion.  Hydrate well.   Rash - feet rash examined by me and supervising physician. Has more of a psoriasis appearance, thickened red/pink patch.  No obvious cracking and maceration of feet as would be expected with tinea.   Begin triamcinolone cream for now, use drying powders and frequent sock changes to help reduce moisture inside of the boots.  F/u in 90mo.    Return soon for physical.

## 2012-07-30 LAB — WOUND CULTURE: Gram Stain: NONE SEEN

## 2012-08-01 ENCOUNTER — Telehealth: Payer: Self-pay | Admitting: Internal Medicine

## 2012-08-01 NOTE — Telephone Encounter (Signed)
Left message word for word on wifes cell

## 2012-08-01 NOTE — Telephone Encounter (Signed)
Explain to her that the antibiotic can help but sometimes even in spite of this the boils can get worse. He can be scheduled to be seen if they are concerned

## 2012-08-04 ENCOUNTER — Encounter (HOSPITAL_COMMUNITY): Payer: Self-pay

## 2012-08-04 ENCOUNTER — Emergency Department (HOSPITAL_COMMUNITY)
Admission: EM | Admit: 2012-08-04 | Discharge: 2012-08-04 | Disposition: A | Discharge status: Home / Self Care | Payer: 59 | Source: Home / Self Care | Attending: Emergency Medicine | Admitting: Emergency Medicine

## 2012-08-04 DIAGNOSIS — R21 Rash and other nonspecific skin eruption: Secondary | ICD-10-CM

## 2012-08-04 DIAGNOSIS — M79676 Pain in unspecified toe(s): Secondary | ICD-10-CM

## 2012-08-04 DIAGNOSIS — M79609 Pain in unspecified limb: Secondary | ICD-10-CM

## 2012-08-04 DIAGNOSIS — Q809 Congenital ichthyosis, unspecified: Secondary | ICD-10-CM

## 2012-08-04 DIAGNOSIS — Q828 Other specified congenital malformations of skin: Secondary | ICD-10-CM

## 2012-08-04 NOTE — ED Provider Notes (Signed)
History     CSN: [id number]  Arrival date & time 08/04/12  1147   First MD Initiated Contact with Patient 08/04/12 1324      Chief Complaint  Patient presents with  . Foot Pain    (Consider location/radiation/quality/duration/timing/severity/associated sxs/prior treatment) HPI Comments: Patient presents urgent care describing that he's been treated by his primary care doctor with antibiotics for a infection of his left upper arm and as well as been diagnosed with pain and swelling on his feet which describes his provider suspicious for psoriasis and has prescribed a cream that he feels his not been working. 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 He is describing that he is unable to walk on his right foot because of pain. Denies any fevers, recent direct trauma or injury to any area of his right foot. Been taking the doxycycline prescription was communicated with a fall to continue taking it as the culture results were available was confirmed that he had a streptococcal skin infection. He does admit that the area of infection in his left lateral aspect of his arm his becoming dry and much smaller than what was initially  The history is provided by the patient.    Past Medical History  Diagnosis Date  . Asthma   . GERD (gastroesophageal reflux disease)   . History of MRSA infection   . Smoker   . Allergy     RHINITIS    Past Surgical History  Procedure Date  . Joint replacement 11/2008    RIGHT SHOULDER  Thurston Hole MD)    No family history on file.  History  Substance Use Topics  . Smoking status: Former Smoker -- 2.0 packs/day    Types: Cigarettes  . Smokeless tobacco: Never Used  . Alcohol Use: No      Review of Systems  Constitutional: Negative for chills, activity change and appetite  change.  Musculoskeletal: Negative for myalgias, back pain, joint swelling and gait problem.  Skin: Positive for rash and wound.    Allergies  Chantix; Levofloxacin; and Penicillins  Home Medications   Current Outpatient Rx  Name  Route  Sig  Dispense  Refill  . ALBUTEROL SULFATE HFA 108 (90 BASE) MCG/ACT IN AERS   Inhalation   Inhale 2 puffs into the lungs every 6 (six) hours as needed for wheezing.   1 Inhaler   0   . CETIRIZINE HCL 10 MG PO TABS   Oral   Take 10 mg by mouth daily.           Marland Kitchen DOXYCYCLINE HYCLATE 100 MG PO TABS   Oral   Take 1 tablet (100 mg total) by mouth 2 (two) times daily.   20 tablet   0   . FLUTICASONE PROPIONATE 50 MCG/ACT NA SUSP   Nasal   Place 1 spray into the nose as needed.           Marland Kitchen PANTOPRAZOLE SODIUM 40 MG PO TBEC   Oral   Take 1 tablet (40 mg total) by mouth 2 (two) times daily.   60 tablet   0   . TRIAMCINOLONE ACETONIDE 0.1 % EX CREA   Topical   Apply topically 2 (two) times daily.   60 g   1     BP 135/83  Pulse 82  Temp 98.1 F (36.7 C) (Oral)  Resp 16  SpO2 99%  Physical Exam  Constitutional: Vital signs are normal. He appears well-developed and well-nourished.  Musculoskeletal: He exhibits tenderness.       Right ankle: He exhibits no swelling.       Feet:  Neurological: He is alert.  Skin: Rash noted. There is erythema.    ED Course  Procedures (including critical care time)  Labs Reviewed - No data to display No results found.   1. Toe pain   2. Rash   3. Xeroderma       MDM  Problem #1 left upper extremity resolving soft tissue infection looks like a previous abscess or a furuncle or lesion. Patient is currently taking doxycycline with good response. (Cultured and checked) Problem #2 bilateral foot pain, at this point am uncertain about the etiology exactly of his pain. This is a non-trauma- injury setting. Exam was more consistent with different stages of abrasions in different surfaces  of friction considering what the patient wears for work I would not be surprised if this is secondary to microtrauma. No signs of a periorbital infection such as paronychias or felons.  Advise patient to continue with antibiotics, to followup with his primary care Dr. to evaluate treatment response. Suggested that perhaps  case a scraping test to be done to his toenails to rule out onychomycosis as a somewhat look thickened. I also explained to patient that there is no signs of an active infection and I don't think that is current symptoms and pain are related to gout. Patient agreed with treatment plan which I described that he can take Tylenol Motrin for discomfort and to continue with his antibiotics as previously prescribed and to followup with his doctor. He also requested a work note which we have provided for tomorrow and encourage him to followup with his doctor if pain prevents him from working      Jimmie Molly, MD 08/04/12 1821

## 2012-08-04 NOTE — ED Notes (Signed)
C/o pain and swelling in feet, states that his PCP Dr Susann Givens has been treating him for psoriasis, states has given him cream for his feet and has been using it for a week with no help, states his toenails are now oozing and the nails are starting to fall off, PCP advised ER

## 2012-08-08 NOTE — Telephone Encounter (Signed)
TSD  

## 2012-09-06 ENCOUNTER — Encounter: Payer: 59 | Admitting: Medical

## 2013-04-17 ENCOUNTER — Other Ambulatory Visit: Payer: Self-pay | Admitting: Family

## 2013-04-17 ENCOUNTER — Ambulatory Visit
Admission: RE | Admit: 2013-04-17 | Discharge: 2013-04-17 | Disposition: A | Discharge status: Home / Self Care | Payer: Worker's Compensation | Source: Ambulatory Visit | Attending: Family | Admitting: Family

## 2013-04-17 DIAGNOSIS — M25561 Pain in right knee: Secondary | ICD-10-CM

## 2013-04-22 ENCOUNTER — Other Ambulatory Visit: Payer: Self-pay | Admitting: Sports Medicine

## 2013-04-22 DIAGNOSIS — M25561 Pain in right knee: Secondary | ICD-10-CM

## 2013-04-24 ENCOUNTER — Ambulatory Visit
Admission: RE | Admit: 2013-04-24 | Discharge: 2013-04-24 | Disposition: A | Discharge status: Home / Self Care | Payer: 59 | Source: Ambulatory Visit | Attending: Sports Medicine | Admitting: Sports Medicine

## 2013-04-24 DIAGNOSIS — M25561 Pain in right knee: Secondary | ICD-10-CM

## 2013-08-18 ENCOUNTER — Encounter (HOSPITAL_COMMUNITY): Payer: Self-pay | Admitting: Emergency Medicine

## 2013-08-18 ENCOUNTER — Emergency Department (HOSPITAL_COMMUNITY): Payer: 59

## 2013-08-18 DIAGNOSIS — R079 Chest pain, unspecified: Secondary | ICD-10-CM | POA: Insufficient documentation

## 2013-08-18 DIAGNOSIS — J45909 Unspecified asthma, uncomplicated: Secondary | ICD-10-CM | POA: Insufficient documentation

## 2013-08-18 DIAGNOSIS — Z87891 Personal history of nicotine dependence: Secondary | ICD-10-CM | POA: Insufficient documentation

## 2013-08-18 DIAGNOSIS — R109 Unspecified abdominal pain: Secondary | ICD-10-CM | POA: Insufficient documentation

## 2013-08-18 LAB — CBC
HCT: 45 % (ref 39.0–52.0)
Hemoglobin: 16.3 g/dL (ref 13.0–17.0)
MCH: 32.5 pg (ref 26.0–34.0)
MCHC: 36.2 g/dL — ABNORMAL HIGH (ref 30.0–36.0)
RBC: 5.01 MIL/uL (ref 4.22–5.81)
RDW: 12.4 % (ref 11.5–15.5)

## 2013-08-18 LAB — COMPREHENSIVE METABOLIC PANEL
Albumin: 4 g/dL (ref 3.5–5.2)
Alkaline Phosphatase: 92 U/L (ref 39–117)
BUN: 7 mg/dL (ref 6–23)
Calcium: 9 mg/dL (ref 8.4–10.5)
GFR calc Af Amer: >90 mL/min (ref >90)
Glucose, Bld: 142 mg/dL — ABNORMAL HIGH (ref 70–99)
Potassium: 3.6 mEq/L (ref 3.5–5.1)
Sodium: 139 mEq/L (ref 135–145)
Total Bilirubin: 0.2 mg/dL — ABNORMAL LOW (ref 0.3–1.2)
Total Protein: 7.4 g/dL (ref 6.0–8.3)

## 2013-08-18 LAB — POCT I-STAT TROPONIN I

## 2013-08-18 LAB — LIPASE, BLOOD: Lipase: 21 U/L (ref 11–59)

## 2013-08-18 NOTE — ED Notes (Signed)
Per pt sts since Tuesday he has been having intermittent chest pain. sts the pain radiates down the whole left side of his abdomen and his LLQ is very tender to touch. sts went to see his primary and had a positive hemoccult. sts Saturday and Sunday his stools were black but they cleared up. sts he has also had some nausea.

## 2013-08-19 ENCOUNTER — Emergency Department (HOSPITAL_COMMUNITY)
Admission: EM | Admit: 2013-08-19 | Discharge: 2013-08-19 | Discharge status: Against Medical Advice | Payer: 59 | Attending: Emergency Medicine | Admitting: Emergency Medicine

## 2013-08-19 NOTE — ED Notes (Signed)
No answer when called.  No one in the lobby answered

## 2013-08-25 ENCOUNTER — Encounter: Payer: Self-pay | Admitting: Physician Assistant

## 2013-08-25 ENCOUNTER — Ambulatory Visit (INDEPENDENT_AMBULATORY_CARE_PROVIDER_SITE_OTHER): Payer: 59 | Admitting: Physician Assistant

## 2013-08-25 ENCOUNTER — Other Ambulatory Visit (INDEPENDENT_AMBULATORY_CARE_PROVIDER_SITE_OTHER): Payer: 59

## 2013-08-25 VITALS — BP 120/84 | HR 72 | Ht 73.0 in | Wt 221.0 lb

## 2013-08-25 DIAGNOSIS — R1032 Left lower quadrant pain: Secondary | ICD-10-CM

## 2013-08-25 DIAGNOSIS — R195 Other fecal abnormalities: Secondary | ICD-10-CM

## 2013-08-25 DIAGNOSIS — K219 Gastro-esophageal reflux disease without esophagitis: Secondary | ICD-10-CM

## 2013-08-25 LAB — CBC WITH DIFFERENTIAL/PLATELET
BASOS PCT: 0.3 % (ref 0.0–3.0)
Basophils Absolute: 0 10*3/uL (ref 0.0–0.1)
EOS PCT: 3.4 % (ref 0.0–5.0)
Eosinophils Absolute: 0.3 10*3/uL (ref 0.0–0.7)
HCT: 46.4 % (ref 39.0–52.0)
Hemoglobin: 16.2 g/dL (ref 13.0–17.0)
LYMPHS ABS: 2.4 10*3/uL (ref 0.7–4.0)
LYMPHS PCT: 28.8 % (ref 12.0–46.0)
MCHC: 34.9 g/dL (ref 30.0–36.0)
MCV: 89.1 fl (ref 78.0–100.0)
MONO ABS: 0.7 10*3/uL (ref 0.1–1.0)
Monocytes Relative: 9.1 % (ref 3.0–12.0)
NEUTROS ABS: 4.8 10*3/uL (ref 1.4–7.7)
Neutrophils Relative %: 58.4 % (ref 43.0–77.0)
Platelets: 322 10*3/uL (ref 150.0–400.0)
RBC: 5.21 Mil/uL (ref 4.22–5.81)
RDW: 12.6 % (ref 11.5–14.6)
WBC: 8.2 10*3/uL (ref 4.5–10.5)

## 2013-08-25 NOTE — Progress Notes (Signed)
Reviewed and agree with management plan.  Jaki Steptoe T. Teri Diltz, MD FACG 

## 2013-08-25 NOTE — Patient Instructions (Signed)
Please go to the basement level to have your labs drawn.  We have given you a work note for tomorrow 08-26-2013.  You have been scheduled for a CT scan of the abdomen and pelvis at [facility] ([address]---this is in the same building as Press [facility]).   You are scheduled on 08-26-2013 at 11:00 am You should arrive at 10;45 am  prior to your appointment time for registration. Please follow the written instructions below on the day of your exam:  WARNING: IF YOU ARE ALLERGIC TO IODINE/X-RAY DYE, PLEASE NOTIFY RADIOLOGY IMMEDIATELY AT [phone]! YOU WILL BE GIVEN A 13 HOUR PREMEDICATION PREP.  1) Do not eat or drink anything after 7:00 am  (4 hours prior to your test) 2) You have been given 2 bottles of oral contrast to drink. The solution may taste better if refrigerated, but do NOT add ice or any other liquid to this solution. Shake  well before drinking.    Drink 1 bottle of contrast @ 9:00 am (2 hours prior to your exam)  Drink 1 bottle of contrast @ 10:00 am  (1 hour prior to your exam)  You may take any medications as prescribed with a small amount of water except for the following: Metformin, Glucophage, Glucovance, Avandamet, Riomet, Fortamet, Actoplus Met, Janumet, Glumetza or Metaglip. The above medications must be held the day of the exam AND 48 hours after the exam.  The purpose of you drinking the oral contrast is to aid in the visualization of your intestinal tract. The contrast solution may cause some diarrhea. Before your exam is started, you will be given a small amount of fluid to drink. Depending on your individual set of symptoms, you may also receive an intravenous injection of x-ray contrast/dye. Plan on being at [facility] for 30 minutes or long, depending on the type of exam you are having performed.  If you have any questions regarding your exam or if you need to reschedule, you may call the CT department at [phone] between the hours of  8:00 am and 5:00 pm, Monday-Friday.  ________________________________________________________________________

## 2013-08-25 NOTE — Progress Notes (Signed)
Subjective:    Patient ID: Evan Bauer, male    DOB: 06-07-1976, 38 y.o.   MRN: [id number]  HPI  Evan Bauer is a pleasant 38 year old male , new to GI today He did have a remote episode of esophagitis in 2001 which was documented on EGD. By Dr. Teena Irani. He has been treated for GERD with Protonix 40 mg daily but states that he has not been taking it regularly recently. Comes in today with a new problem of left lower cautery pain which has been present over the past week and a half. He was actually sent to the emergency room by his primary care physician after he had complained of lower abdominal wall or pain and was documented to have Hemoccult-positive stools. However the patient says he had labs done but did not have any imaging at the emergency room. He did have a chest x-ray with that was negative, had troponins done which were normal. CBC showed hemoglobin 16.3 hematocrit of 45.0, normal white count and CMET  was unremarkable. Patient denies any dysuria or hematuria and has no history of ureteral lithiasis. He says that on Saturday, December 27 he had 3-4 bowel movements, and  Noted dark black tarry stool. He denies any use of Pepto-Bismol. He has not been using any aspirin or NSAIDs. As stated above he was seen by his primary care physician was noted to be Hemoccult-positive. The patient says he had fairly constant lower abdominal  discomfort in the left side for about a week and now over the past several days says the pain has been intermittent but will not completely resolve. His bowel movements have some been somewhat irregular and less frequent recently. He says he last noticed a dark stool on Friday, January 2 and says his stools cleared since then. He has not had any fever or chills he has had some vague nausea ,no vomiting ,appetite has been fine. On questioning he says his energy level has been somewhat decreased for him.    Review of Systems  Constitutional: Positive for fatigue.    HENT: Negative.   Eyes: Negative.   Respiratory: Negative.   Cardiovascular: Negative.   Gastrointestinal: Positive for abdominal pain.  Endocrine: Negative.   Genitourinary: Negative.   Musculoskeletal: Negative.   Allergic/Immunologic: Negative.   Neurological: Negative.   Hematological: Negative.   Psychiatric/Behavioral: Negative.    Outpatient Prescriptions Prior to Visit  Medication Sig Dispense Refill  . albuterol (VENTOLIN HFA) 108 (90 BASE) MCG/ACT inhaler Inhale 2 puffs into the lungs every 6 (six) hours as needed for wheezing.  1 Inhaler  0  . cetirizine (ZYRTEC) 10 MG tablet Take 10 mg by mouth daily.        . fluticasone (FLONASE) 50 MCG/ACT nasal spray Place 1 spray into the nose as needed.        . pantoprazole (PROTONIX) 40 MG tablet Take 1 tablet (40 mg total) by mouth 2 (two) times daily.  60 tablet  0   No facility-administered medications prior to visit.       Allergies  Allergen Reactions  . Chantix [Varenicline Tartrate]     REACTION: suicidal/homicidal thoughts  . Levofloxacin     REACTION: closes throat  . Penicillins     REACTION: closes thorat   Patient Active Problem List   Diagnosis Date Noted  . GERD (gastroesophageal reflux disease) 08/25/2013  . Influenza 09/26/2011  . Asthma 09/26/2011  . CHEST PAIN-UNSPECIFIED 02/11/2010  . TOBACCO ABUSE 12/02/2008  .  ASTHMA 12/02/2008  . WHEEZING 12/02/2008  . SNORING 12/02/2008   History  Substance Use Topics  . Smoking status: Current Every Day Smoker -- 2.00 packs/day    Types: Cigarettes  . Smokeless tobacco: Never Used  . Alcohol Use: No   family history includes Sarcoidosis in his mother; Stroke in his father.  Objective:   Physical Exam  well-developed young healthy appearing white male in no acute distress, accompanied by his wife blood pressure 120/44 pulse 72 height 6 foot 1 weight 221. HEENT; nontraumatic normocephalic EOMI PERRLA sclera anicteric, Supple ;no JVD, Cardiovascula;r  regular rate and rhythm with S1-S2 no murmur or gallop, Pulmonar;y clear bilaterally, Abdomen; soft he is tender in the left lower quadrant and mildly in the suprapubic area there is no guarding or rebound no palpable mass or hepatosplenomegaly bowel sounds are present, Rectal; exam not done, Extremities; no clubbing cyanosis or edema skin warm and dry, Psych; mood and affect appropriate        Assessment & Plan:  #26  38 year old male with 1/2-2 week history of left lower quadrant pain ,intermittent dark stool with documented Hemoccult-positive stool and vague nausea. Etiology of his symptoms is not clear we'll need to rule out occult colon lesion, diverticulitis or colitis #2 history of GERD #3 asthma  Plan; repeat CBC with differential today Schedule for CT scan of the abdomen and pelvis with contrast-Further plans pending results of CT, if this is negative he may need colonoscopy with Dr. Fuller Plan

## 2013-08-26 ENCOUNTER — Other Ambulatory Visit: Payer: Self-pay | Admitting: *Deleted

## 2013-08-26 ENCOUNTER — Ambulatory Visit (INDEPENDENT_AMBULATORY_CARE_PROVIDER_SITE_OTHER)
Admission: RE | Admit: 2013-08-26 | Discharge: 2013-08-26 | Disposition: A | Discharge status: Home / Self Care | Payer: 59 | Source: Ambulatory Visit | Attending: Physician Assistant | Admitting: Physician Assistant

## 2013-08-26 DIAGNOSIS — R195 Other fecal abnormalities: Secondary | ICD-10-CM

## 2013-08-26 DIAGNOSIS — R1032 Left lower quadrant pain: Secondary | ICD-10-CM

## 2013-08-26 MED ORDER — PANTOPRAZOLE SODIUM 40 MG PO TBEC: 40.0000 mg | DELAYED_RELEASE_TABLET | Freq: Two times a day (BID) | ORAL | Status: AC

## 2013-08-26 MED ORDER — IOHEXOL 300 MG/ML  SOLN
100.0000 mL | Freq: Once | INTRAMUSCULAR | Status: AC | PRN
  Administered 2013-08-26: 100 mL via INTRAVENOUS

## 2013-12-08 ENCOUNTER — Ambulatory Visit (INDEPENDENT_AMBULATORY_CARE_PROVIDER_SITE_OTHER): Payer: 59 | Admitting: Gastroenterology

## 2013-12-08 ENCOUNTER — Other Ambulatory Visit (INDEPENDENT_AMBULATORY_CARE_PROVIDER_SITE_OTHER): Payer: 59

## 2013-12-08 ENCOUNTER — Encounter: Payer: Self-pay | Admitting: Gastroenterology

## 2013-12-08 ENCOUNTER — Telehealth: Payer: Self-pay | Admitting: Gastroenterology

## 2013-12-08 VITALS — BP 110/78 | HR 80 | Ht 73.0 in | Wt 231.4 lb

## 2013-12-08 DIAGNOSIS — R1032 Left lower quadrant pain: Secondary | ICD-10-CM

## 2013-12-08 DIAGNOSIS — R195 Other fecal abnormalities: Secondary | ICD-10-CM

## 2013-12-08 LAB — COMPREHENSIVE METABOLIC PANEL
ALT: 27 U/L (ref 0–53)
AST: 20 U/L (ref 0–37)
Albumin: 4 g/dL (ref 3.5–5.2)
Alkaline Phosphatase: 80 U/L (ref 39–117)
BUN: 10 mg/dL (ref 6–23)
CALCIUM: 9 mg/dL (ref 8.4–10.5)
CHLORIDE: 103 meq/L (ref 96–112)
CO2: 27 meq/L (ref 19–32)
Creatinine, Ser: 1.1 mg/dL (ref 0.4–1.5)
GFR: 85.01 mL/min (ref >60.00)
GLUCOSE: 84 mg/dL (ref 70–99)
Potassium: 3.6 mEq/L (ref 3.5–5.1)
Sodium: 138 mEq/L (ref 135–145)
Total Bilirubin: 0.7 mg/dL (ref 0.3–1.2)
Total Protein: 7.3 g/dL (ref 6.0–8.3)

## 2013-12-08 LAB — CBC WITH DIFFERENTIAL/PLATELET
Basophils Absolute: 0 10*3/uL (ref 0.0–0.1)
Basophils Relative: 0.4 % (ref 0.0–3.0)
Eosinophils Absolute: 0.3 10*3/uL (ref 0.0–0.7)
Eosinophils Relative: 4.1 % (ref 0.0–5.0)
HCT: 46.8 % (ref 39.0–52.0)
HEMOGLOBIN: 16.1 g/dL (ref 13.0–17.0)
Lymphocytes Relative: 35.5 % (ref 12.0–46.0)
Lymphs Abs: 3 10*3/uL (ref 0.7–4.0)
MCHC: 34.3 g/dL (ref 30.0–36.0)
MCV: 90.6 fl (ref 78.0–100.0)
Monocytes Absolute: 0.8 10*3/uL (ref 0.1–1.0)
Monocytes Relative: 9.9 % (ref 3.0–12.0)
NEUTROS ABS: 4.2 10*3/uL (ref 1.4–7.7)
Neutrophils Relative %: 50.1 % (ref 43.0–77.0)
Platelets: 306 10*3/uL (ref 150.0–400.0)
RBC: 5.16 Mil/uL (ref 4.22–5.81)
RDW: 12.8 % (ref 11.5–14.6)
WBC: 8.4 10*3/uL (ref 4.5–10.5)

## 2013-12-08 LAB — LIPASE: Lipase: 21 U/L (ref 11.0–59.0)

## 2013-12-08 MED ORDER — PEG-KCL-NACL-NASULF-NA ASC-C 100 G PO SOLR: 1.0000 | Freq: Once | ORAL | Status: DC

## 2013-12-08 NOTE — Patient Instructions (Addendum)
You have been given a separate informational sheet regarding your tobacco use, the importance of quitting and local resources to help you quit.  Your physician has requested that you go to the basement for the following lab work before leaving today:Cmet, CBC, Lipase.  You have been scheduled for a CT scan of the abdomen and pelvis at [facility] ([address]---this is in the same building as [employer]).   You are scheduled on 12/12/13 at 11:00am. You should arrive 15 minutes prior to your appointment time for registration. Please follow the written instructions below on the day of your exam:  WARNING: IF YOU ARE ALLERGIC TO IODINE/X-RAY DYE, PLEASE NOTIFY RADIOLOGY IMMEDIATELY AT [phone]! YOU WILL BE GIVEN A 13 HOUR PREMEDICATION PREP.  1) Do not eat or drink anything after 7:00am (4 hours prior to your test) 2) You have been given 2 bottles of oral contrast to drink. The solution may taste better if refrigerated, but do NOT add ice or any other liquid to this solution. Shake well before drinking.    Drink 1 bottle of contrast @ 9:00am (2 hours prior to your exam)  Drink 1 bottle of contrast @ 10:00am (1 hour prior to your exam)  You may take any medications as prescribed with a small amount of water except for the following: Metformin, Glucophage, Glucovance, Avandamet, Riomet, Fortamet, Actoplus Met, Janumet, Glumetza or Metaglip. The above medications must be held the day of the exam AND 48 hours after the exam.  The purpose of you drinking the oral contrast is to aid in the visualization of your intestinal tract. The contrast solution may cause some diarrhea. Before your exam is started, you will be given a small amount of fluid to drink. Depending on your individual set of symptoms, you may also receive an intravenous injection of x-ray contrast/dye. Plan on being at [facility] for 30 minutes or long, depending on the type of exam you are having  performed.  This test typically takes 30-45 minutes to complete.  If you have any questions regarding your exam or if you need to reschedule, you may call the CT department at [phone] between the hours of 8:00 am and 5:00 pm, Monday-Friday.  ________________________________________________________________________  Evan Bauer have been scheduled for a colonoscopy with propofol. Please follow written instructions given to you at your visit today.  Please pick up your prep kit at the pharmacy within the next 1-3 days. If you use inhalers (even only as needed), please bring them with you on the day of your procedure.  Thank you for choosing me and [facility].  Evan Bauer. Evan Bauer., MD., [facility]

## 2013-12-08 NOTE — Progress Notes (Signed)
    History of Present Illness: This is a 38 year old white male accompanied by his wife. He relates intermittent left lower quadrant pain for the past few months. He also has a mild constipation and intermittent black stools. CT scan in 08/2013 of the abdomen/pelvis showed epiploic appendagitis. Hemoccult-positive stool was documented. His LLQ pain worsened today and was unable to continue at work. Denies weight loss, diarrhea, change in stool caliber, hematochezia, nausea, vomiting, dysphagia, reflux symptoms, chest pain.  Current Medications, Allergies, Past Medical History, Past Surgical History, Family History and Social History were reviewed in [employer] record.  Physical Exam: General: Well developed , well nourished, no acute distress Head: Normocephalic and atraumatic Eyes:  sclerae anicteric, EOMI Ears: Normal auditory acuity Mouth: No deformity or lesions Lungs: Clear throughout to auscultation Heart: Regular rate and rhythm; no murmurs, rubs or bruits Abdomen: Soft, moderate left lower quadrant tenderness to deep palpation with guarding, no rebound and non distended. No masses, hepatosplenomegaly or hernias noted. Normal Bowel sounds Rectal: Deferred to colonoscopy Musculoskeletal: Symmetrical with no gross deformities  Pulses:  Normal pulses noted Extremities: No clubbing, cyanosis, edema or deformities noted Neurological: Alert oriented x 4, grossly nonfocal Psychological:  Alert and cooperative. Normal mood and affect  Assessment and Recommendations:  1. Left lower quadrant abdominal pain, black stools, Hemoccult positive stool, mild constipation. Rule out ulcer disease, IBD and other disorders. Obtain blood work today. MOM daily when necessary. Continue pantoprazole 40 mg daily. Schedule abdominal/pelvic CT. Schedule colonoscopy and upper endoscopy. The risks, benefits, and alternatives to colonoscopy with possible biopsy and possible polypectomy were  discussed with the patient and they consent to proceed. The risks, benefits, and alternatives to endoscopy with possible biopsy and possible dilation were discussed with the patient and they consent to proceed.

## 2013-12-08 NOTE — Telephone Encounter (Signed)
Patient will come in and see Dr. Fuller Plan today at 2:15

## 2013-12-12 ENCOUNTER — Encounter: Payer: Self-pay | Admitting: Gastroenterology

## 2013-12-12 ENCOUNTER — Inpatient Hospital Stay: Admission: RE | Admit: 2013-12-12 | Payer: 59 | Source: Ambulatory Visit

## 2013-12-26 ENCOUNTER — Ambulatory Visit (AMBULATORY_SURGERY_CENTER): Payer: 59 | Admitting: Gastroenterology

## 2013-12-26 ENCOUNTER — Encounter: Payer: Self-pay | Admitting: Gastroenterology

## 2013-12-26 VITALS — BP 115/76 | HR 73 | Temp 97.2°F | Resp 21 | Ht 73.0 in | Wt 231.0 lb

## 2013-12-26 DIAGNOSIS — D126 Benign neoplasm of colon, unspecified: Secondary | ICD-10-CM

## 2013-12-26 DIAGNOSIS — K219 Gastro-esophageal reflux disease without esophagitis: Secondary | ICD-10-CM

## 2013-12-26 DIAGNOSIS — R1032 Left lower quadrant pain: Secondary | ICD-10-CM

## 2013-12-26 DIAGNOSIS — R195 Other fecal abnormalities: Secondary | ICD-10-CM

## 2013-12-26 MED ORDER — SODIUM CHLORIDE 0.9 % IV SOLN: 500.0000 mL | INTRAVENOUS | Status: DC

## 2013-12-26 NOTE — Progress Notes (Signed)
Called to room to assist during endoscopic procedure.  Patient ID and intended procedure confirmed with present staff. Received instructions for my participation in the procedure from the performing physician.  

## 2013-12-26 NOTE — Progress Notes (Addendum)
Pt O2 sats were on the lower side while in recovery, pt denies chest pain and is asymptomatic, pt did take two puffs of his inhaler administered by this wife-adm.  Pt is heavy smoker

## 2013-12-26 NOTE — Progress Notes (Signed)
  [facility] Post-op Note  Patient: Evan Bauer  Procedure(s) Performed: colonoscopy and endoscopy  Patient Location: [facility] - Recovery Area  Anesthesia Type: Deep Sedation/Propofol  Level of Consciousness: awake, oriented and patient cooperative  Airway and Oxygen Therapy: Patient Spontanous Breathing  Post-op Pain: none  Post-op Assessment:  Post-op Vital signs reviewed, Patient's Cardiovascular Status Stable, Respiratory Function Stable, Patent Airway, No signs of Nausea or vomiting and Pain level controlled  Post-op Vital Signs: Reviewed and stable  Complications: No apparent anesthesia complications  Verlin Grills 9:33 AM

## 2013-12-26 NOTE — Op Note (Signed)
[place]  [address] [place], [address]   ENDOSCOPY PROCEDURE REPORT  PATIENT: Evan Bauer, Evan Bauer.  MR#: [id number] BIRTHDATE: 01/22/1976 , 37  yrs. old GENDER: Male ENDOSCOPIST: Ladene Artist, MD, [facility] REFERRED BY:  Cammie Fulp PROCEDURE DATE:  12/26/2013 PROCEDURE:  EGD, diagnostic ASA CLASS:     Class II INDICATIONS:  GERD.  Heme positive stool.  Abdominal pain in the lower left quadrant. MEDICATIONS: There was residual sedation effect present from prior procedure, MAC sedation, administered by CRNA, and propofol (Diprivan) 150mg  IV TOPICAL ANESTHETIC: none DESCRIPTION OF PROCEDURE: After the risks benefits and alternatives of the procedure were thoroughly explained, informed consent was obtained.  The LB [id number] endoscope was introduced through the mouth and advanced to the second portion of the duodenum. Without limitations.  The instrument was slowly withdrawn as the mucosa was fully examined.  ESOPHAGUS: There was LA Class B esophagitis noted.   The esophagus was otherwise normal. STOMACH: The mucosa and folds of the stomach appeared normal. DUODENUM: The duodenal mucosa showed no abnormalities in the bulb and second portion of the duodenum.  Retroflexed views revealed a 4 cm hiatal hernia.   The scope was then withdrawn from the patient and the procedure completed.  COMPLICATIONS: There were no complications.  ENDOSCOPIC IMPRESSION: 1.   LA Class B esophagitis 2.   Hiatal hernia  RECOMMENDATIONS: 1.  Anti-reflux regimen long term 2.  Continue PPI bid long term  eSigned:  Ladene Artist, MD, [facility] 12/26/2013 9:25 AM

## 2013-12-26 NOTE — Patient Instructions (Addendum)
YOU HAD AN ENDOSCOPIC PROCEDURE TODAY AT THE [facility]: Refer to the procedure report that was given to you for any specific questions about what was found during the examination.  If the procedure report does not answer your questions, please call your gastroenterologist to clarify.  If you requested that your care partner not be given the details of your procedure findings, then the procedure report has been included in a sealed envelope for you to review at your convenience later.  YOU SHOULD EXPECT: Some feelings of bloating in the abdomen. Passage of more gas than usual.  Walking can help get rid of the air that was put into your GI tract during the procedure and reduce the bloating. If you had a lower endoscopy (such as a colonoscopy or flexible sigmoidoscopy) you may notice spotting of blood in your stool or on the toilet paper. If you underwent a bowel prep for your procedure, then you may not have a normal bowel movement for a few days.  DIET: Your first meal following the procedure should be a light meal and then it is ok to progress to your normal diet.  A half-sandwich or bowl of soup is an example of a good first meal.  Heavy or fried foods are harder to digest and may make you feel nauseous or bloated.  Likewise meals heavy in dairy and vegetables can cause extra gas to form and this can also increase the bloating.  Drink plenty of fluids but you should avoid alcoholic beverages for 24 hours.  ACTIVITY: Your care partner should take you home directly after the procedure.  You should plan to take it easy, moving slowly for the rest of the day.  You can resume normal activity the day after the procedure however you should NOT DRIVE or use heavy machinery for 24 hours (because of the sedation medicines used during the test).    SYMPTOMS TO REPORT IMMEDIATELY: A gastroenterologist can be reached at any hour.  During normal business hours, 8:30 AM to 5:00 PM Monday through Friday,  call [phone].  After hours and on weekends, please call the GI answering service at [phone] who will take a message and have the physician on call contact you.   Following lower endoscopy (colonoscopy or flexible sigmoidoscopy):  Excessive amounts of blood in the stool  Significant tenderness or worsening of abdominal pains  Swelling of the abdomen that is new, acute  Fever of 100F or higher  Following upper endoscopy (EGD)  Vomiting of blood or coffee ground material  New chest pain or pain under the shoulder blades  Painful or persistently difficult swallowing  New shortness of breath  Fever of 100F or higher  Black, tarry-looking stools  FOLLOW UP: If any biopsies were taken you will be contacted by phone or by letter within the next 1-3 weeks.  Call your gastroenterologist if you have not heard about the biopsies in 3 weeks.  Our staff will call the home number listed on your records the next business day following your procedure to check on you and address any questions or concerns that you may have at that time regarding the information given to you following your procedure. This is a courtesy call and so if there is no answer at the home number and we have not heard from you through the emergency physician on call, we will assume that you have returned to your regular daily activities without incident.  SIGNATURES/CONFIDENTIALITY: You and/or your care   partner have signed paperwork which will be entered into your electronic medical record.  These signatures attest to the fact that that the information above on your After Visit Summary has been reviewed and is understood.  Full responsibility of the confidentiality of this discharge information lies with you and/or your care-partner.  Polyps, hemorrhoids, esophagitis, hiatal hernia-handouts given  Repeat colonoscopy will be determined by pathology.  Anti-reflux regimen.  Continue protonix twice a day long  term.

## 2013-12-26 NOTE — Op Note (Signed)
[place]  [address] [place], [address]   COLONOSCOPY PROCEDURE REPORT  PATIENT: Evan Bauer, Evan Bauer.  MR#: [id number] BIRTHDATE: 1976-07-28 , 37  yrs. old GENDER: Male ENDOSCOPIST: Ladene Artist, MD, [facility] REFERRED HW:TUUEKC Fulp PROCEDURE DATE:  12/26/2013 PROCEDURE:   Colonoscopy with biopsy and snare polypectomy First Screening Colonoscopy - Avg.  risk and is 50 yrs.  old or older - No.  Prior Negative Screening - Now for repeat screening. N/A  History of Adenoma - Now for follow-up colonoscopy & has been > or = to 3 yrs.  N/A  Polyps Removed Today? Yes. ASA CLASS:   Class II INDICATIONS:abdominal pain in the lower left quadrant and heme-positive stool. MEDICATIONS: MAC sedation, administered by CRNA and propofol (Diprivan) 400mg  IV DESCRIPTION OF PROCEDURE:   After the risks benefits and alternatives of the procedure were thoroughly explained, informed consent was obtained.  A digital rectal exam revealed no abnormalities of the rectum.   The LB [id number]  endoscope was introduced through the anus and advanced to the terminal ileum which was intubated for a short distance. No adverse events experienced.   The quality of the prep was good, using MoviPrep The instrument was then slowly withdrawn as the colon was fully examined.  COLON FINDINGS: Two sessile polyps measuring 3-4 mm in size were found in the transverse colon.  A polypectomy was performed with cold forceps.  The resection was complete and the polyp tissue was completely retrieved.   A sessile polyp measuring 6 mm in size was found in the sigmoid colon.  A polypectomy was performed with a cold snare.  The resection was complete and the polyp tissue was completely retrieved.   The colon was otherwise normal.  There was no diverticulosis, inflammation, polyps or cancers unless previously stated.  Retroflexed views revealed small internal hemorrhoids. The time to cecum=1 minutes 44  seconds.  Withdrawal time=13 minutes 19 seconds.  The scope was withdrawn and the procedure completed. COMPLICATIONS: There were no complications.  ENDOSCOPIC IMPRESSION: 1.   Two sessile polyps measuring 3-4 mm in the transverse colon; polypectomy performed with cold forceps 2.   Sessile polyp measuring 6 mm in the sigmoid colon; polypectomy performed with a cold snare 3.   Small internal hemorrhoids  RECOMMENDATIONS: 1.  Await pathology results 2.  Repeat colonoscopy in 5 years if polyp(s) adenomatous; otherwise at age 68  eSigned:  Ladene Artist, MD, [facility] 12/26/2013 9:18 AM

## 2013-12-29 ENCOUNTER — Telehealth: Payer: Self-pay | Admitting: *Deleted

## 2013-12-29 NOTE — Telephone Encounter (Signed)
  Follow up Call-  Call back number 12/26/2013  Post procedure Call Back phone  # [phone]  Permission to leave phone message Yes     Patient questions:  Do you have a fever, pain , or abdominal swelling? no Pain Score  0 *  Have you tolerated food without any problems? yes  Have you been able to return to your normal activities? yes  Do you have any questions about your discharge instructions: Diet   no Medications  no Follow up visit  no  Do you have questions or concerns about your Care? no  Actions: * If pain score is 4 or above: No action needed, pain <4.

## 2013-12-30 ENCOUNTER — Encounter: Payer: Self-pay | Admitting: Gastroenterology

## 2014-01-26 ENCOUNTER — Emergency Department (HOSPITAL_COMMUNITY)
Admission: EM | Admit: 2014-01-26 | Discharge: 2014-01-26 | Disposition: A | Discharge status: Home / Self Care | Payer: 59 | Attending: Emergency Medicine | Admitting: Emergency Medicine

## 2014-01-26 ENCOUNTER — Encounter (HOSPITAL_COMMUNITY): Payer: Self-pay | Admitting: Emergency Medicine

## 2014-01-26 DIAGNOSIS — R42 Dizziness and giddiness: Secondary | ICD-10-CM | POA: Insufficient documentation

## 2014-01-26 DIAGNOSIS — R5381 Other malaise: Secondary | ICD-10-CM | POA: Insufficient documentation

## 2014-01-26 DIAGNOSIS — K219 Gastro-esophageal reflux disease without esophagitis: Secondary | ICD-10-CM | POA: Insufficient documentation

## 2014-01-26 DIAGNOSIS — R5383 Other fatigue: Secondary | ICD-10-CM

## 2014-01-26 DIAGNOSIS — Z880 Allergy status to penicillin: Secondary | ICD-10-CM | POA: Insufficient documentation

## 2014-01-26 DIAGNOSIS — R51 Headache: Secondary | ICD-10-CM | POA: Insufficient documentation

## 2014-01-26 DIAGNOSIS — Z8614 Personal history of Methicillin resistant Staphylococcus aureus infection: Secondary | ICD-10-CM | POA: Insufficient documentation

## 2014-01-26 DIAGNOSIS — Z79899 Other long term (current) drug therapy: Secondary | ICD-10-CM | POA: Insufficient documentation

## 2014-01-26 DIAGNOSIS — J449 Chronic obstructive pulmonary disease, unspecified: Secondary | ICD-10-CM | POA: Insufficient documentation

## 2014-01-26 DIAGNOSIS — R404 Transient alteration of awareness: Secondary | ICD-10-CM | POA: Insufficient documentation

## 2014-01-26 DIAGNOSIS — Z77098 Contact with and (suspected) exposure to other hazardous, chiefly nonmedicinal, chemicals: Secondary | ICD-10-CM | POA: Insufficient documentation

## 2014-01-26 DIAGNOSIS — R209 Unspecified disturbances of skin sensation: Secondary | ICD-10-CM | POA: Insufficient documentation

## 2014-01-26 DIAGNOSIS — J4489 Other specified chronic obstructive pulmonary disease: Secondary | ICD-10-CM | POA: Insufficient documentation

## 2014-01-26 DIAGNOSIS — F17200 Nicotine dependence, unspecified, uncomplicated: Secondary | ICD-10-CM | POA: Insufficient documentation

## 2014-01-26 DIAGNOSIS — IMO0002 Reserved for concepts with insufficient information to code with codable children: Secondary | ICD-10-CM | POA: Insufficient documentation

## 2014-01-26 DIAGNOSIS — Z8659 Personal history of other mental and behavioral disorders: Secondary | ICD-10-CM | POA: Insufficient documentation

## 2014-01-26 LAB — CBC
HCT: 42.9 % (ref 39.0–52.0)
HEMOGLOBIN: 15.5 g/dL (ref 13.0–17.0)
MCH: 32.1 pg (ref 26.0–34.0)
MCHC: 36.1 g/dL — ABNORMAL HIGH (ref 30.0–36.0)
MCV: 88.8 fL (ref 78.0–100.0)
PLATELETS: 267 10*3/uL (ref 150–400)
RBC: 4.83 MIL/uL (ref 4.22–5.81)
RDW: 12.5 % (ref 11.5–15.5)
WBC: 9.7 10*3/uL (ref 4.0–10.5)

## 2014-01-26 LAB — COMPREHENSIVE METABOLIC PANEL
ALT: 22 U/L (ref 0–53)
AST: 18 U/L (ref 0–37)
Albumin: 4.1 g/dL (ref 3.5–5.2)
Alkaline Phosphatase: 96 U/L (ref 39–117)
BUN: 11 mg/dL (ref 6–23)
CALCIUM: 9.3 mg/dL (ref 8.4–10.5)
CO2: 26 mEq/L (ref 19–32)
Chloride: 102 mEq/L (ref 96–112)
Creatinine, Ser: 1.24 mg/dL (ref 0.50–1.35)
GFR calc Af Amer: 86 mL/min — ABNORMAL LOW (ref >90)
GFR, EST NON AFRICAN AMERICAN: 74 mL/min — AB (ref >90)
Glucose, Bld: 93 mg/dL (ref 70–99)
Potassium: 3.9 mEq/L (ref 3.7–5.3)
Sodium: 140 mEq/L (ref 137–147)
Total Bilirubin: 0.4 mg/dL (ref 0.3–1.2)
Total Protein: 6.9 g/dL (ref 6.0–8.3)

## 2014-01-26 LAB — I-STAT TROPONIN, ED: Troponin i, poc: 0 ng/mL (ref 0.00–0.08)

## 2014-01-26 LAB — CBG MONITORING, ED: GLUCOSE-CAPILLARY: 98 mg/dL (ref 70–99)

## 2014-01-26 NOTE — ED Notes (Addendum)
Received via EMS from work site with c/o exposure to an unknown chemical. Pt symptoms include syncope, weakness, headache and chest discomfort. Pt was given 2 tylenol by Co-worker. Pt reports on Thursday the CO2 detector alarmed and at that time pt and some co-workers experienced tingling and funny feeling in throat. Pt now feels fatigue.

## 2014-01-26 NOTE — ED Notes (Signed)
Family at bedside. 

## 2014-01-27 NOTE — ED Provider Notes (Signed)
CSN: [id number]     Arrival date & time 01/26/14  1020 History   First MD Initiated Contact with Patient 01/26/14 1030     Chief Complaint  Patient presents with  . Chemical Exposure  . Loss of Consciousness      HPI Patient works for the [employer] of [place] and works in many "pump houses".  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  Patient was given to over-the-counter Tylenol by his coworker without improvement in his symptoms.  He states they were there several days ago and the carbon dioxide was tested and was found to be okay.  He states many other coworkers had similar symptoms in the same pump house.  He presents now for evaluation.  He states since being out of the pump house today he feels much better.  He is asymptomatic at this time.   Past Medical History  Diagnosis Date  . Asthma   . GERD (gastroesophageal reflux disease)   . History of MRSA infection   . Smoker   . Allergy     RHINITIS  . Depression   . COPD (chronic obstructive pulmonary disease)    Past Surgical History  Procedure Laterality Date  . Joint replacement  11/2008    RIGHT SHOULDER  Noemi Chapel MD)   Family History  Problem Relation Age of Onset  . Stroke Father   . Sarcoidosis Mother    History  Substance Use Topics  . Smoking status: Current Every Day Smoker -- 2.00 packs/day    Types: Cigarettes  . Smokeless tobacco: Never Used  . Alcohol Use: No    Review of Systems  All other systems reviewed and are negative.     Allergies  Chantix; Levofloxacin; and Penicillins  Home Medications   Prior to Admission medications   Medication Sig Start Date End Date Taking? Authorizing Provider  albuterol (VENTOLIN HFA) 108 (90 BASE) MCG/ACT inhaler Inhale 2 puffs into the lungs every 6 (six) hours as needed for wheezing. 01/08/12   Camelia Eng Tysinger, PA-C   cetirizine (ZYRTEC) 10 MG tablet Take 10 mg by mouth daily.      Historical Provider, MD  fluticasone (FLONASE) 50 MCG/ACT nasal spray Place 1 spray into the nose as needed.      Historical Provider, MD  pantoprazole (PROTONIX) 40 MG tablet Take 1 tablet (40 mg total) by mouth 2 (two) times daily. 08/26/13   Amy S Esterwood, PA-C   BP 106/69  Pulse 76  Temp(Src) 98.4 F (36.9 C) (Oral)  Resp 19  Ht 6\' 1"  (1.854 m)  Wt 220 lb (99.791 kg)  BMI 29.03 kg/m2  SpO2 97% Physical Exam  Nursing note and vitals reviewed. Constitutional: He is oriented to person, place, and time. He appears well-developed and well-nourished.  HENT:  Head: Normocephalic and atraumatic.  Eyes: EOM are normal.  Neck: Normal range of motion.  Cardiovascular: Normal rate, regular rhythm, normal heart sounds and intact distal pulses.   Pulmonary/Chest: Effort normal and breath sounds normal. No respiratory distress.  Abdominal: Soft. He exhibits no distension. There is no tenderness.  Musculoskeletal: Normal range of motion.  Neurological: He is alert and oriented to person, place, and time.  Skin: Skin is warm and dry.  Psychiatric: He has a normal mood and affect. Judgment normal.    ED Course  Procedures (including  critical care time) Labs Review Labs Reviewed  COMPREHENSIVE METABOLIC PANEL - Abnormal; Notable for the following:    GFR calc non Af Amer 74 (*)    GFR calc Af Amer 86 (*)    All other components within normal limits  CBC - Abnormal; Notable for the following:    MCHC 36.1 (*)    All other components within normal limits  CBG MONITORING, ED  I-STAT TROPOININ, ED    Imaging Review No results found.   EKG Interpretation   Date/Time:  Monday January 26 2014 10:25:00 EDT Ventricular Rate:  88 PR Interval:  171 QRS Duration: 108 QT Interval:  351 QTC Calculation: 425 R Axis:   37 Text Interpretation:  Sinus rhythm nonspecific ST changes No significant  change was found Confirmed by  Hend Mccarrell  MD, Jacobi Ryant ([id number]) on 01/26/2014  10:45:36 AM      MDM   Final diagnoses:  Chemical exposure    Overall well-appearing.  Observed in the emergency department.  Unknown exposure.  I recommended that he speak with environmental services at the city of [facility] versus health department for additional testing of the facilities themselves.    Hoy Morn, MD 01/27/14 [phone]

## 2015-08-30 ENCOUNTER — Ambulatory Visit: Payer: Self-pay

## 2015-08-30 ENCOUNTER — Other Ambulatory Visit: Payer: Self-pay | Admitting: Occupational Medicine

## 2015-08-30 DIAGNOSIS — M25511 Pain in right shoulder: Secondary | ICD-10-CM

## 2016-04-19 ENCOUNTER — Institutional Professional Consult (permissible substitution): Payer: Self-pay | Admitting: Internal Medicine

## 2016-04-20 ENCOUNTER — Institutional Professional Consult (permissible substitution): Payer: Self-pay | Admitting: Internal Medicine

## 2016-09-02 DIAGNOSIS — M5136 Other intervertebral disc degeneration, lumbar region: Secondary | ICD-10-CM | POA: Diagnosis not present

## 2016-10-06 DIAGNOSIS — M9905 Segmental and somatic dysfunction of pelvic region: Secondary | ICD-10-CM | POA: Diagnosis not present

## 2016-10-06 DIAGNOSIS — M9903 Segmental and somatic dysfunction of lumbar region: Secondary | ICD-10-CM | POA: Diagnosis not present

## 2016-10-06 DIAGNOSIS — M5442 Lumbago with sciatica, left side: Secondary | ICD-10-CM | POA: Diagnosis not present

## 2017-05-11 DIAGNOSIS — J45909 Unspecified asthma, uncomplicated: Secondary | ICD-10-CM | POA: Diagnosis not present

## 2017-10-15 ENCOUNTER — Ambulatory Visit
Admission: RE | Admit: 2017-10-15 | Discharge: 2017-10-15 | Disposition: A | Discharge status: Home / Self Care | Payer: Worker's Compensation | Source: Ambulatory Visit | Attending: Nurse Practitioner | Admitting: Nurse Practitioner

## 2017-10-15 ENCOUNTER — Other Ambulatory Visit: Payer: Self-pay | Admitting: Nurse Practitioner

## 2017-10-15 DIAGNOSIS — T1490XA Injury, unspecified, initial encounter: Secondary | ICD-10-CM

## 2018-03-15 ENCOUNTER — Ambulatory Visit (HOSPITAL_COMMUNITY)
Admission: RE | Admit: 2018-03-15 | Discharge: 2018-03-15 | Disposition: A | Discharge status: Home / Self Care | Payer: 59 | Source: Ambulatory Visit | Attending: Adult Health Nurse Practitioner | Admitting: Adult Health Nurse Practitioner

## 2018-03-15 ENCOUNTER — Other Ambulatory Visit (HOSPITAL_COMMUNITY): Payer: Self-pay | Admitting: Adult Health Nurse Practitioner

## 2018-03-15 DIAGNOSIS — M5430 Sciatica, unspecified side: Secondary | ICD-10-CM | POA: Diagnosis not present

## 2018-03-15 DIAGNOSIS — M545 Low back pain: Secondary | ICD-10-CM | POA: Diagnosis not present

## 2018-03-15 DIAGNOSIS — M47817 Spondylosis without myelopathy or radiculopathy, lumbosacral region: Secondary | ICD-10-CM | POA: Diagnosis not present

## 2018-03-20 ENCOUNTER — Other Ambulatory Visit: Payer: Self-pay | Admitting: Internal Medicine

## 2018-03-20 DIAGNOSIS — M545 Low back pain: Secondary | ICD-10-CM

## 2018-03-28 ENCOUNTER — Ambulatory Visit (HOSPITAL_COMMUNITY)
Admission: RE | Admit: 2018-03-28 | Discharge: 2018-03-28 | Disposition: A | Discharge status: Home / Self Care | Payer: 59 | Source: Ambulatory Visit | Attending: Internal Medicine | Admitting: Internal Medicine

## 2018-03-28 DIAGNOSIS — R29890 Loss of height: Secondary | ICD-10-CM | POA: Diagnosis not present

## 2018-03-28 DIAGNOSIS — M2578 Osteophyte, vertebrae: Secondary | ICD-10-CM | POA: Diagnosis not present

## 2018-03-28 DIAGNOSIS — M5137 Other intervertebral disc degeneration, lumbosacral region: Secondary | ICD-10-CM | POA: Diagnosis not present

## 2018-03-28 DIAGNOSIS — M545 Low back pain: Secondary | ICD-10-CM | POA: Diagnosis not present

## 2018-03-28 DIAGNOSIS — M5127 Other intervertebral disc displacement, lumbosacral region: Secondary | ICD-10-CM | POA: Insufficient documentation

## 2018-03-28 DIAGNOSIS — M5126 Other intervertebral disc displacement, lumbar region: Secondary | ICD-10-CM | POA: Diagnosis not present

## 2018-03-28 DIAGNOSIS — M8938 Hypertrophy of bone, other site: Secondary | ICD-10-CM | POA: Diagnosis not present

## 2018-03-28 DIAGNOSIS — M4807 Spinal stenosis, lumbosacral region: Secondary | ICD-10-CM | POA: Diagnosis not present

## 2018-03-28 DIAGNOSIS — M5124 Other intervertebral disc displacement, thoracic region: Secondary | ICD-10-CM | POA: Insufficient documentation

## 2018-03-28 DIAGNOSIS — M5134 Other intervertebral disc degeneration, thoracic region: Secondary | ICD-10-CM | POA: Insufficient documentation

## 2018-03-28 DIAGNOSIS — M48061 Spinal stenosis, lumbar region without neurogenic claudication: Secondary | ICD-10-CM | POA: Diagnosis not present

## 2018-04-12 DIAGNOSIS — Z0001 Encounter for general adult medical examination with abnormal findings: Secondary | ICD-10-CM | POA: Diagnosis not present

## 2018-04-12 DIAGNOSIS — K219 Gastro-esophageal reflux disease without esophagitis: Secondary | ICD-10-CM | POA: Diagnosis not present

## 2018-04-12 DIAGNOSIS — M545 Low back pain: Secondary | ICD-10-CM | POA: Diagnosis not present

## 2018-09-19 ENCOUNTER — Emergency Department (HOSPITAL_COMMUNITY)
Admission: EM | Admit: 2018-09-19 | Discharge: 2018-09-19 | Disposition: A | Discharge status: Home / Self Care | Payer: 59 | Attending: Emergency Medicine | Admitting: Emergency Medicine

## 2018-09-19 ENCOUNTER — Emergency Department (HOSPITAL_COMMUNITY): Payer: 59

## 2018-09-19 ENCOUNTER — Encounter (HOSPITAL_COMMUNITY): Payer: Self-pay

## 2018-09-19 DIAGNOSIS — J449 Chronic obstructive pulmonary disease, unspecified: Secondary | ICD-10-CM | POA: Diagnosis not present

## 2018-09-19 DIAGNOSIS — I10 Essential (primary) hypertension: Secondary | ICD-10-CM | POA: Diagnosis not present

## 2018-09-19 DIAGNOSIS — R079 Chest pain, unspecified: Secondary | ICD-10-CM | POA: Diagnosis not present

## 2018-09-19 DIAGNOSIS — R0789 Other chest pain: Secondary | ICD-10-CM | POA: Diagnosis not present

## 2018-09-19 DIAGNOSIS — F17210 Nicotine dependence, cigarettes, uncomplicated: Secondary | ICD-10-CM | POA: Diagnosis not present

## 2018-09-19 DIAGNOSIS — Z79899 Other long term (current) drug therapy: Secondary | ICD-10-CM | POA: Diagnosis not present

## 2018-09-19 LAB — BASIC METABOLIC PANEL
Anion gap: 8 (ref 5–15)
BUN: 7 mg/dL (ref 6–20)
CALCIUM: 8.8 mg/dL — AB (ref 8.9–10.3)
CHLORIDE: 104 mmol/L (ref 98–111)
CO2: 23 mmol/L (ref 22–32)
Creatinine, Ser: 1.08 mg/dL (ref 0.61–1.24)
GFR calc non Af Amer: >60 mL/min (ref >60)
Glucose, Bld: 100 mg/dL — ABNORMAL HIGH (ref 70–99)
Potassium: 4.4 mmol/L (ref 3.5–5.1)
SODIUM: 135 mmol/L (ref 135–145)

## 2018-09-19 LAB — I-STAT TROPONIN, ED
TROPONIN I, POC: 0 ng/mL (ref 0.00–0.08)
TROPONIN I, POC: 0 ng/mL (ref 0.00–0.08)

## 2018-09-19 LAB — CBC
HCT: 44.3 % (ref 39.0–52.0)
Hemoglobin: 15.3 g/dL (ref 13.0–17.0)
MCH: 31.2 pg (ref 26.0–34.0)
MCHC: 34.5 g/dL (ref 30.0–36.0)
MCV: 90.2 fL (ref 80.0–100.0)
NRBC: 0 % (ref 0.0–0.2)
PLATELETS: 312 10*3/uL (ref 150–400)
RBC: 4.91 MIL/uL (ref 4.22–5.81)
RDW: 12 % (ref 11.5–15.5)
WBC: 9.2 10*3/uL (ref 4.0–10.5)

## 2018-09-19 MED ORDER — SODIUM CHLORIDE 0.9% FLUSH
3.0000 mL | Freq: Once | INTRAVENOUS | Status: AC
  Administered 2018-09-19: 3 mL via INTRAVENOUS

## 2018-09-19 MED ORDER — ACETAMINOPHEN 500 MG PO TABS
1000.0000 mg | ORAL_TABLET | Freq: Once | ORAL | Status: AC
  Administered 2018-09-19: 1000 mg via ORAL
  Filled 2018-09-19: qty 2

## 2018-09-19 NOTE — ED Notes (Signed)
Pt requested tylenol. Verbal order given by Dr Ronnald Nian

## 2018-09-19 NOTE — ED Notes (Signed)
Patient verbalizes understanding of discharge instructions. Opportunity for questioning and answers were provided. Armband removed by staff, pt discharged from ED ambulatory.   

## 2018-09-19 NOTE — ED Provider Notes (Signed)
[facility] [facility] EMERGENCY DEPARTMENT Provider Note   CSN: [id number] Arrival date & time: 09/19/18  3299     History   Chief Complaint Chief Complaint  Patient presents with  . Chest Pain    HPI KINCADE GRANBERG is a 43 y.o. male.  The history is provided by the patient.  Chest Pain  Pain location:  L chest Pain quality: aching   Pain radiates to:  L arm Pain severity:  Mild Onset quality:  Gradual Timing:  Intermittent Progression:  Resolved Chronicity:  New Context: movement and at rest   Relieved by:  Nothing Worsened by:  Nothing Associated symptoms: no abdominal pain, no anorexia, no anxiety, no back pain, no cough, no diaphoresis, no dizziness, no dysphagia, no fever, no headache, no heartburn, no numbness, no orthopnea, no palpitations, no shortness of breath, no syncope, no vomiting and no weakness   Risk factors: male sex and smoking   Risk factors: no coronary artery disease, no high cholesterol and no hypertension     Past Medical History:  Diagnosis Date  . Allergy    RHINITIS  . Asthma   . COPD (chronic obstructive pulmonary disease) ([place])   . Depression   . GERD (gastroesophageal reflux disease)   . History of MRSA infection   . Smoker     Patient Active Problem List   Diagnosis Date Noted  . GERD (gastroesophageal reflux disease) 08/25/2013  . Influenza 09/26/2011  . Asthma 09/26/2011  . CHEST PAIN-UNSPECIFIED 02/11/2010  . TOBACCO ABUSE 12/02/2008  . ASTHMA 12/02/2008  . WHEEZING 12/02/2008  . SNORING 12/02/2008    Past Surgical History:  Procedure Laterality Date  . JOINT REPLACEMENT  11/2008   RIGHT SHOULDER  Noemi Chapel MD)        Home Medications    Prior to Admission medications   Medication Sig Start Date End Date Taking? Authorizing Provider  albuterol (VENTOLIN HFA) 108 (90 BASE) MCG/ACT inhaler Inhale 2 puffs into the lungs every 6 (six) hours as needed for wheezing. 01/08/12   Tysinger, Camelia Eng, PA-C    cetirizine (ZYRTEC) 10 MG tablet Take 10 mg by mouth daily.      [provider]  fluticasone (FLONASE) 50 MCG/ACT nasal spray Place 1 spray into the nose as needed.      [provider]  pantoprazole (PROTONIX) 40 MG tablet Take 1 tablet (40 mg total) by mouth 2 (two) times daily. 08/26/13   Esterwood, Amy S, PA-C    Family History Family History  Problem Relation Age of Onset  . Stroke Father   . Sarcoidosis Mother     Social History Social History   Tobacco Use  . Smoking status: Current Every Day Smoker    Packs/day: 2.00    Types: Cigarettes  . Smokeless tobacco: Never Used  Substance Use Topics  . Alcohol use: No  . Drug use: No     Allergies   Chantix [varenicline tartrate]; Levofloxacin; and Penicillins   Review of Systems Review of Systems  Constitutional: Negative for chills, diaphoresis and fever.  HENT: Negative for ear pain, sore throat and trouble swallowing.   Eyes: Negative for pain and visual disturbance.  Respiratory: Negative for cough and shortness of breath.   Cardiovascular: Positive for chest pain. Negative for palpitations, orthopnea and syncope.  Gastrointestinal: Negative for abdominal pain, anorexia, heartburn and vomiting.  Genitourinary: Negative for dysuria and hematuria.  Musculoskeletal: Negative for arthralgias and back pain.  Skin: Negative for color change  and rash.  Neurological: Negative for dizziness, seizures, syncope, weakness, numbness and headaches.  Psychiatric/Behavioral:       Stress  All other systems reviewed and are negative.    Physical Exam Updated Vital Signs  ED Triage Vitals  Enc Vitals Group     BP 09/19/18 0945 123/85     Pulse Rate 09/19/18 0945 76     Resp 09/19/18 0945 12     Temp 09/19/18 0934 97.6 F (36.4 C)     Temp Source 09/19/18 0934 Oral     SpO2 09/19/18 0945 95 %     Weight --      Height --      Head Circumference --      Peak Flow --      Pain Score 09/19/18 0933 2      Pain Loc --      Pain Edu? --      Excl. in [place]? --     Physical Exam Vitals signs and nursing note reviewed.  Constitutional:      Appearance: He is well-developed.  HENT:     Head: Normocephalic and atraumatic.  Eyes:     Extraocular Movements: Extraocular movements intact.     Conjunctiva/sclera: Conjunctivae normal.     Pupils: Pupils are equal, round, and reactive to light.  Neck:     Musculoskeletal: Normal range of motion and neck supple.  Cardiovascular:     Rate and Rhythm: Normal rate and regular rhythm.     Pulses:          Radial pulses are 2+ on the right side and 2+ on the left side.       Dorsalis pedis pulses are 2+ on the right side and 2+ on the left side.     Heart sounds: No murmur.  Pulmonary:     Effort: Pulmonary effort is normal. No respiratory distress.     Breath sounds: Normal breath sounds. No decreased breath sounds, wheezing, rhonchi or rales.  Abdominal:     Palpations: Abdomen is soft.     Tenderness: There is no abdominal tenderness.  Musculoskeletal: Normal range of motion.     Right lower leg: No edema.     Left lower leg: No edema.  Skin:    General: Skin is warm and dry.     Capillary Refill: Capillary refill takes less than 2 seconds.  Neurological:     General: No focal deficit present.     Mental Status: He is alert and oriented to person, place, and time.     Cranial Nerves: No cranial nerve deficit.     Motor: No weakness.     Comments: 5+ out of 5 strength throughout, normal sensation, no drift, normal finger-to-nose finger, no speech changes      ED Treatments / Results  Labs (all labs ordered are listed, but only abnormal results are displayed) Labs Reviewed  CBC  BASIC METABOLIC PANEL  I-STAT TROPONIN, ED    EKG EKG Interpretation  Date/Time:  Thursday September 19 2018 09:35:09 EST Ventricular Rate:  74 PR Interval:    QRS Duration: 99 QT Interval:  380 QTC Calculation: 422 R Axis:   43 Text  Interpretation:  Sinus rhythm Anteroseptal infarct, old No significant change since last tracing Confirmed by Lennice Sites [phone]) on 09/19/2018 9:52:49 AM   Radiology Dg Chest 2 View  Result Date: 09/19/2018 CLINICAL DATA:  Left-sided chest pain radiating to the arm. EXAM: CHEST - 2 VIEW  COMPARISON:  08/18/2013 FINDINGS: Normal heart size and mediastinal contours. No acute infiltrate or edema. No effusion or pneumothorax. No acute osseous findings. IMPRESSION: Negative chest. Electronically Signed   By: Monte Fantasia M.D.   On: 09/19/2018 10:12    Procedures Procedures (including critical care time)  Medications Ordered in ED Medications  sodium chloride flush (NS) 0.9 % injection 3 mL (has no administration in time range)     Initial Impression / Assessment and Plan / ED Course  I have reviewed the triage vital signs and the nursing notes.  Pertinent labs & imaging results that were available during my care of the patient were reviewed by me and considered in my medical decision making (see chart for details).     ARMONTE TORTORELLA is a 43 year old male with history of COPD, depression who presents to the ED with chest pain.  Patient with normal vitals.  No fever.  Patient with intermittent chest pain for the last 12 to 16 hours.  Has no chest pain now.  Symptoms have resolved.  He states that he has some pain to the left side of his chest that went down his left arm.  Went down his left side of his body.  Thought that he possibly pulled a muscle.  Does have some reproducible tenderness on exam.  Patient has normal pulses throughout.  History and physical is not consistent with dissection/stroke.  Patient had EKG that showed sinus rhythm.  No signs of ischemic changes.  Unchanged from prior.  Patient has a history of smoking but no other cardiac risk factors.  Clear breath sounds on exam.  Patient does admit to anxiety, stress.  However, he feels that since he started antidepressants  that his depression has improved.  Patient denies any infectious symptoms.  Patient has a low heart score.  Will evaluate with 2 troponins.  Will get chest x-ray, basic labs and reevaluate.  Patient is PERC negative and doubt PE.  Patient with 2 troponins that were within normal limits.  Doubt ACS.  Patient had no signs of pneumonia, pneumothorax, pleural effusion on chest x-ray.  No significant anemia, electrolyte abnormality, kidney injury.  Patient continued to be chest pain-free throughout my care.  Recommend follow-up with primary care doctor for outpatient stress test.  Given return precautions.  Discharged from the ED in good condition.  This chart was dictated using voice recognition software.  Despite best efforts to proofread,  errors can occur which can change the documentation meaning.    Final Clinical Impressions(s) / ED Diagnoses   Final diagnoses:  Atypical chest pain    ED Discharge Orders    None       Lennice Sites, DO 09/19/18 1330

## 2018-09-19 NOTE — ED Triage Notes (Signed)
Pt presents for L sided chest pain with radiation to L arm and L leg starting last night. HTN, no home meds.

## 2018-09-19 NOTE — ED Triage Notes (Signed)
Pt received 324 ASA and 1 nitro in route. Had some nausea with standing per EMS, given 4 mg zofran.

## 2018-09-23 DIAGNOSIS — F17200 Nicotine dependence, unspecified, uncomplicated: Secondary | ICD-10-CM | POA: Diagnosis not present

## 2018-09-23 DIAGNOSIS — R0789 Other chest pain: Secondary | ICD-10-CM | POA: Diagnosis not present

## 2018-10-29 DIAGNOSIS — J069 Acute upper respiratory infection, unspecified: Secondary | ICD-10-CM | POA: Diagnosis not present

## 2019-01-16 ENCOUNTER — Encounter: Payer: Self-pay | Admitting: Gastroenterology

## 2019-02-04 ENCOUNTER — Encounter (HOSPITAL_COMMUNITY): Payer: Self-pay | Admitting: *Deleted

## 2019-02-04 ENCOUNTER — Inpatient Hospital Stay (HOSPITAL_COMMUNITY)
Admission: AD | Admit: 2019-02-04 | Discharge: 2019-02-06 | DRG: 885 | Disposition: A | Discharge status: Home / Self Care | Payer: 59 | Source: Intra-hospital | Attending: Psychiatry | Admitting: Psychiatry

## 2019-02-04 ENCOUNTER — Emergency Department (HOSPITAL_COMMUNITY)
Admission: EM | Admit: 2019-02-04 | Discharge: 2019-02-04 | Disposition: A | Discharge status: Other Acute Inpatient Hospital | Payer: 59 | Attending: Emergency Medicine | Admitting: Emergency Medicine

## 2019-02-04 ENCOUNTER — Other Ambulatory Visit: Payer: Self-pay

## 2019-02-04 DIAGNOSIS — F332 Major depressive disorder, recurrent severe without psychotic features: Secondary | ICD-10-CM | POA: Diagnosis present

## 2019-02-04 DIAGNOSIS — J449 Chronic obstructive pulmonary disease, unspecified: Secondary | ICD-10-CM | POA: Diagnosis present

## 2019-02-04 DIAGNOSIS — Z915 Personal history of self-harm: Secondary | ICD-10-CM

## 2019-02-04 DIAGNOSIS — T424X2A Poisoning by benzodiazepines, intentional self-harm, initial encounter: Secondary | ICD-10-CM | POA: Diagnosis not present

## 2019-02-04 DIAGNOSIS — Z03818 Encounter for observation for suspected exposure to other biological agents ruled out: Secondary | ICD-10-CM | POA: Diagnosis not present

## 2019-02-04 DIAGNOSIS — F17210 Nicotine dependence, cigarettes, uncomplicated: Secondary | ICD-10-CM | POA: Insufficient documentation

## 2019-02-04 DIAGNOSIS — Z79899 Other long term (current) drug therapy: Secondary | ICD-10-CM | POA: Insufficient documentation

## 2019-02-04 DIAGNOSIS — F32A Depression, unspecified: Secondary | ICD-10-CM

## 2019-02-04 DIAGNOSIS — R45851 Suicidal ideations: Secondary | ICD-10-CM | POA: Insufficient documentation

## 2019-02-04 DIAGNOSIS — Z823 Family history of stroke: Secondary | ICD-10-CM | POA: Diagnosis not present

## 2019-02-04 DIAGNOSIS — F329 Major depressive disorder, single episode, unspecified: Secondary | ICD-10-CM | POA: Diagnosis present

## 2019-02-04 HISTORY — DX: Anxiety disorder, unspecified: F41.9

## 2019-02-04 LAB — CBC
HCT: 45.5 % (ref 39.0–52.0)
Hemoglobin: 15.6 g/dL (ref 13.0–17.0)
MCH: 30.8 pg (ref 26.0–34.0)
MCHC: 34.3 g/dL (ref 30.0–36.0)
MCV: 89.9 fL (ref 80.0–100.0)
Platelets: 286 10*3/uL (ref 150–400)
RBC: 5.06 MIL/uL (ref 4.22–5.81)
RDW: 12.4 % (ref 11.5–15.5)
WBC: 10.2 10*3/uL (ref 4.0–10.5)
nRBC: 0 % (ref 0.0–0.2)

## 2019-02-04 LAB — COMPREHENSIVE METABOLIC PANEL
ALT: 18 U/L (ref 0–44)
AST: 15 U/L (ref 15–41)
Albumin: 4 g/dL (ref 3.5–5.0)
Alkaline Phosphatase: 79 U/L (ref 38–126)
Anion gap: 11 (ref 5–15)
BUN: 7 mg/dL (ref 6–20)
CO2: 22 mmol/L (ref 22–32)
Calcium: 8.8 mg/dL — ABNORMAL LOW (ref 8.9–10.3)
Chloride: 106 mmol/L (ref 98–111)
Creatinine, Ser: 1 mg/dL (ref 0.61–1.24)
GFR calc Af Amer: >60 mL/min (ref >60)
GFR calc non Af Amer: >60 mL/min (ref >60)
Glucose, Bld: 104 mg/dL — ABNORMAL HIGH (ref 70–99)
Potassium: 3.2 mmol/L — ABNORMAL LOW (ref 3.5–5.1)
Sodium: 139 mmol/L (ref 135–145)
Total Bilirubin: 0.2 mg/dL — ABNORMAL LOW (ref 0.3–1.2)
Total Protein: 7 g/dL (ref 6.5–8.1)

## 2019-02-04 LAB — SALICYLATE LEVEL: Salicylate Lvl: <7 mg/dL (ref 2.8–30.0)

## 2019-02-04 LAB — RAPID URINE DRUG SCREEN, HOSP PERFORMED
Amphetamines: NOT DETECTED
Barbiturates: NOT DETECTED
Benzodiazepines: POSITIVE — AB
Cocaine: NOT DETECTED
Opiates: NOT DETECTED
Tetrahydrocannabinol: NOT DETECTED

## 2019-02-04 LAB — ETHANOL: Alcohol, Ethyl (B): <10 mg/dL (ref <10)

## 2019-02-04 LAB — ACETAMINOPHEN LEVEL: Acetaminophen (Tylenol), Serum: <10 ug/mL — ABNORMAL LOW (ref 10–30)

## 2019-02-04 LAB — SARS CORONAVIRUS 2 BY RT PCR (HOSPITAL ORDER, PERFORMED IN ~~LOC~~ HOSPITAL LAB): SARS Coronavirus 2: NEGATIVE

## 2019-02-04 MED ORDER — ALBUTEROL SULFATE HFA 108 (90 BASE) MCG/ACT IN AERS: 2.0000 | INHALATION_SPRAY | Freq: Four times a day (QID) | RESPIRATORY_TRACT | Status: DC | PRN

## 2019-02-04 NOTE — Progress Notes (Signed)
Pt accepted to [facility], room 400-2 Mordecai Maes, NP is the accepting provider.   Dr. Mallie Darting is the attending provider.   Call report to [phone]   [facility] notified.    Pt is voluntary and can be transported [place]. Pt is scheduled to arrive at [facility] [place], [place], [place] Disposition CSW [facility]/TTS [id number] [phone]

## 2019-02-04 NOTE — Progress Notes (Signed)
Psychoeducational Group Note  Date:  02/04/2019 Time:  2254  Group Topic/Focus:  Wrap-Up Group:   The focus of this group is to help patients review their daily goal of treatment and discuss progress on daily workbooks.  Participation Level: Did Not Attend  Participation Quality:  Not Applicable  Affect:  Not Applicable  Cognitive:  Not Applicable  Insight:  Not Applicable  Engagement in Group: Not Applicable  Additional Comments:  The patient did not attend group this evening.   Archie Balboa S 02/04/2019, 10:54 PM

## 2019-02-04 NOTE — ED Notes (Signed)
Per PA, wife talked with her and she wants to come pick up patient's wallet. Have notified patient and notified security.

## 2019-02-04 NOTE — ED Notes (Signed)
Earlier, Actuary stated that monitor started alarming and Respirations went to 0. Was unsure if it was his leads getting disconnected or if he really had periods of Apnea. Pt has not had any periods of Apnea that this nurse has seen, but this nurse feels more comfortable keeping patient on the monitor. Sitter at bedside

## 2019-02-04 NOTE — ED Notes (Signed)
Sitter at bedside. Room is NOT stripped bc patient has NOT been medically cleared yet. Once medically cleared, will strip room

## 2019-02-04 NOTE — Tx Team (Signed)
Initial Treatment Plan 02/04/2019 11:13 PM Evan Bauer MNO:[id number]    PATIENT STRESSORS: Marital or family conflict   PATIENT STRENGTHS: Ability for insight Average or above average intelligence General fund of knowledge   PATIENT IDENTIFIED PROBLEMS: Anxiety  Alteration in mood depressed                   DISCHARGE CRITERIA:  Ability to meet basic life and health needs Improved stabilization in mood, thinking, and/or behavior Need for constant or close observation no longer present Reduction of life-threatening or endangering symptoms to within safe limits  PRELIMINARY DISCHARGE PLAN: Outpatient therapy Return to previous living arrangement Return to previous work or school arrangements  PATIENT/FAMILY INVOLVEMENT: This treatment plan has been presented to and reviewed with the patient, Evan Bauer, and/or family member, The patient and family have been given the opportunity to ask questions and make suggestions.  Raul Del, RN 02/04/2019, 11:13 PM

## 2019-02-04 NOTE — ED Provider Notes (Signed)
[facility] EMERGENCY DEPARTMENT Provider Note   CSN: [id number] Arrival date & time: 02/04/19  1350    History   Chief Complaint Chief Complaint  Patient presents with  . Suicide Attempt  . Depression    HPI Evan Bauer is a 43 y.o. male w PMHx depression, asthma, GERD, presenting via EMS with intentional overdose of Xanax.  Patient does not know how many Xanax he took, however per phone conversation with his wife, she is prescribed 0.5 mg tabs of Xanax, she filled 60 tabs on 2 June, and takes 2 tabs at bedtime.  She is not returned home yet, and is unsure of how many pills are missing from the bottle.  She states he has been struggling with severe depression for the last 1-1/2 weeks, has been laying in the bed.  She states there have been increased stressors regarding family lately that has been worsening his symptoms.  He did take a dose of his Pristiq this morning.  She states he does not drink alcohol, he also denies this.  He denies any other ingestions.  He states "I just 1 to be with my dad."  His wife states his father has passed.     The history is provided by the patient and the spouse.    Past Medical History:  Diagnosis Date  . Allergy    RHINITIS  . Asthma   . COPD (chronic obstructive pulmonary disease) ([place])   . Depression   . GERD (gastroesophageal reflux disease)   . History of MRSA infection   . Smoker     Patient Active Problem List   Diagnosis Date Noted  . GERD (gastroesophageal reflux disease) 08/25/2013  . Influenza 09/26/2011  . Asthma 09/26/2011  . CHEST PAIN-UNSPECIFIED 02/11/2010  . TOBACCO ABUSE 12/02/2008  . ASTHMA 12/02/2008  . WHEEZING 12/02/2008  . SNORING 12/02/2008    Past Surgical History:  Procedure Laterality Date  . JOINT REPLACEMENT  11/2008   RIGHT SHOULDER  Noemi Chapel MD)        Home Medications    Prior to Admission medications   Medication Sig Start Date End Date Taking? Authorizing Provider  albuterol  (VENTOLIN HFA) 108 (90 BASE) MCG/ACT inhaler Inhale 2 puffs into the lungs every 6 (six) hours as needed for wheezing. 01/08/12   Tysinger, Camelia Eng, PA-C  cetirizine (ZYRTEC) 10 MG tablet Take 10 mg by mouth daily.      [provider]  fluticasone (FLONASE) 50 MCG/ACT nasal spray Place 1 spray into the nose as needed.      [provider]  pantoprazole (PROTONIX) 40 MG tablet Take 1 tablet (40 mg total) by mouth 2 (two) times daily. 08/26/13   Esterwood, Amy S, PA-C    Family History Family History  Problem Relation Age of Onset  . Stroke Father   . Sarcoidosis Mother     Social History Social History   Tobacco Use  . Smoking status: Current Every Day Smoker    Packs/day: 2.00    Types: Cigarettes  . Smokeless tobacco: Never Used  Substance Use Topics  . Alcohol use: No  . Drug use: No     Allergies   Chantix [varenicline tartrate], Levofloxacin, and Penicillins   Review of Systems Review of Systems  Psychiatric/Behavioral: Positive for dysphoric mood and suicidal ideas.  All other systems reviewed and are negative.    Physical Exam Updated Vital Signs BP 117/90   Pulse 74   Temp 97.8 F (36.6 C) (  Oral)   Resp 16   Wt 75 kg   SpO2 97%   BMI 21.81 kg/m   Physical Exam Vitals signs and nursing note reviewed.  Constitutional:      Appearance: He is well-developed.     Comments: Patient is tearful on exam.  Appears drowsy though alert and responding to verbal stimuli.  HENT:     Head: Normocephalic and atraumatic.  Eyes:     Conjunctiva/sclera: Conjunctivae normal.  Cardiovascular:     Rate and Rhythm: Normal rate and regular rhythm.  Pulmonary:     Effort: Pulmonary effort is normal. No respiratory distress.     Breath sounds: Normal breath sounds.  Abdominal:     General: Bowel sounds are normal.     Palpations: Abdomen is soft.     Tenderness: There is no abdominal tenderness. There is no guarding or rebound.  Skin:    General: Skin  is warm.  Neurological:     Mental Status: He is alert.     Comments: Oriented to year, person and place.  He is unable to tell me the month or the day. PERRLA, EOM grossly intact.  Cranial nerves grossly intact.  Spontaneously moving all extremities.  Psychiatric:        Behavior: Behavior normal.      ED Treatments / Results  Labs (all labs ordered are listed, but only abnormal results are displayed) Labs Reviewed  COMPREHENSIVE METABOLIC PANEL - Abnormal; Notable for the following components:      Result Value   Potassium 3.2 (*)    Glucose, Bld 104 (*)    Calcium 8.8 (*)    Total Bilirubin 0.2 (*)    All other components within normal limits  ACETAMINOPHEN LEVEL - Abnormal; Notable for the following components:   Acetaminophen (Tylenol), Serum <10 (*)    All other components within normal limits  RAPID URINE DRUG SCREEN, HOSP PERFORMED - Abnormal; Notable for the following components:   Benzodiazepines POSITIVE (*)    All other components within normal limits  SARS CORONAVIRUS 2 (HOSPITAL ORDER, [facility])  ETHANOL  SALICYLATE LEVEL  CBC    EKG EKG Interpretation  Date/Time:  Tuesday February 04 2019 14:00:52 EDT Ventricular Rate:  77 PR Interval:    QRS Duration: 105 QT Interval:  366 QTC Calculation: 415 R Axis:   39 Text Interpretation:  Sinus rhythm ST elev, probable normal early repol pattern No significant change since last tracing Confirmed by Fredia Sorrow [phone]) on 02/04/2019 2:04:27 PM   Radiology No results found.  Procedures Procedures (including critical care time)  Medications Ordered in ED Medications - No data to display   Initial Impression / Assessment and Plan / ED Course  I have reviewed the triage vital signs and the nursing notes.  Pertinent labs & imaging results that were available during my care of the patient were reviewed by me and considered in my medical decision making (see chart for details).   Clinical Course as of Feb 03 1805  Tue Feb 04, 2019  1412 Spoke with patient's wife, Evan Bauer, over the phone.  She reports he has been struggling with her depression for the last 1-1.5wks with issues with extended family.  She states he has been laying in the bed throughout this time.  She left the house this morning around 11:30 AM, and thinks he must of taken the sometime after that.  She is prescribed 0.5mg  xanax, and filled 60 tabs on  June 2.  She states she takes 2 tabs at bedtime.  She is not made at home yet, however will call back with amount of missing pills.  She states he did take his antidepressants this morning, Pristiq.  He states about 12-13 yrs ago he had an unintentional OD of Chantix, however has never had an intentional overdose.  He does not drink alcohol or use other drugs.   [JR]  1518 Per patients wife, 38 left in the bottle, however sometimes she pours a few left over from previous bottle when she gets a new prescription. States he may have taken no more than 4.    [JR]  1630 Pt re-evaluated. Pt sleeping, however easily arousable with verbal stimuli. VS remain stable. At this time, pt is medically cleared for TTS evaluation.    [JR]    Clinical Course User Index [JR] Robinson, Martin[place] N, PA-C       Patient presenting voluntarily from home after intentional overdose of Xanax.  With further discussion with his wife, it appears only a few pills of her 0.5 mg tablets of Xanax are missing, his wife states he could have taken more than a handful (5) tabs.  On evaluation, he is sleepy, tearful, however conversant and alert.  He intentionally took this medication with the intent of harming himself and ending his life.  His wife reports he has been in the bed for at least 1-1/2 weeks struggling from severe depression, and states he needs inpatient help.  Patient is also agreeable to this.  No other other ingestions.  Labs today are reassuring, acetaminophen, alcohol, and salicylate level  are undetectable.  UDS is only positive for benzos.  Vital signs have remained stable throughout stay.  At this time, patient is medically cleared for TTS evaluation.  TTS to determine disposition.  Patient discussed with Dr.s Rogene Houston and Roderic Palau.  Final Clinical Impressions(s) / ED Diagnoses   Final diagnoses:  Depression, unspecified depression type  Suicide attempt by benzodiazepine overdose [facility])    ED Discharge Orders    None       Robinson, Martin[place] N, PA-C 02/04/19 1837    Fredia Sorrow, MD 02/05/19 1655

## 2019-02-04 NOTE — BH Assessment (Signed)
Tele Assessment Note   Patient Name: Evan Bauer MRN: [id number] Referring Physician: Martinique Robinson, PA Location of Patient: [facility] Location of Provider: [facility]  Evan Bauer is an 43 y.o. male who presented to [place] after making a suicide attempt by overdosing on Xanax. Patient states that he has considered his mother-in-law and his sister-in-law to be his family, but he states that the whole family is on the outs with one another and not speaking.  Patient states that he has done a lot to help them and he cannot understand why he is being treated this way.  Patient states that his father died in 30-Nov-2017 and he states that he has never gotten over this loss.  Patient states that he took the pills because he thought that everyone would be better off without him and the family would come back together if he was gone.   Patient states that he hears sometimes voices that tell him to do things, but he would not elaborate and he did not appear to be responding to any internal stimuli.  Most likely it is his own voice in his head because he has no diagnosed history of psychosis.  Patient states that he was prescribed Chantix in the past and he had an adverse reaction to it and states that he has not been the same since.  He states, "it messed up my brain and it is only going to get worse.  I won the lawsuit."  Patient states that he has not been sleeping or eating well and states that he has lost about twenty pounds. Patient denies any drug or alcohol use.  Patient has been married for nineteen years and states that he has work for the [employer] for the past twenty years.  He states that he has three children.  He has a 25 year old autistic son as well as an 13 year old and a 60 year old.  He states that his wife is supportive of him.    Patient presented as alert and oriented, but was anxious and tearful.  He did not appear to be responding to any internal  stimuli.  His speech was pressured and he held his hand over his face the entire assessment.  His judgment, insight and impulse control are impaired.  Patient's memory was intact and his thoughts organized.    TTS spoke to patient's wife, Evan Bauer [phone], who states that her family has issues with they are raising patient's 50 year old autistic child and feel like they could do a better job.  When offered the opportunity to do this, they refuse.  However, they called APS on them and the family is no longer speaking and patient's wife has taken out a restraining order on her sister's boyfriend.  She states that they are trying to sell their house because the family all lives together on the same plot of land.  Wife states that patient has a hard timing coping and this situation has thrown him over the edge.   Diagnosis: F32.2 MDD Recurrent Severe without psychosis  Past Medical History:  Past Medical History:  Diagnosis Date  . Allergy    RHINITIS  . Asthma   . COPD (chronic obstructive pulmonary disease) ([place])   . Depression   . GERD (gastroesophageal reflux disease)   . History of MRSA infection   . Smoker     Past Surgical History:  Procedure Laterality Date  . JOINT REPLACEMENT  11/2008   RIGHT SHOULDER  Evan Chapel MD)    Family History:  Family History  Problem Relation Age of Onset  . Stroke Father   . Sarcoidosis Mother     Social History:  reports that he has been smoking cigarettes. He has been smoking about 2.00 packs per day. He has never used smokeless tobacco. He reports that he does not drink alcohol or use drugs.  Additional Social History:  Alcohol / Drug Use Pain Medications: see MAR Prescriptions: see MAR Over the Counter: see MAR History of alcohol / drug use?: No history of alcohol / drug abuse Longest period of sobriety (when/how long): N/A  CIWA: CIWA-Ar BP: 117/90 Pulse Rate: 74 COWS:    Allergies:  Allergies  Allergen Reactions  .  Chantix [Varenicline Tartrate]     REACTION: suicidal/homicidal thoughts  . Levofloxacin     REACTION: closes throat  . Penicillins     REACTION: closes thorat    Home Medications: (Not in a hospital admission)   OB/GYN Status:  No LMP for male patient.  General Assessment Data TTS Assessment: In system Is this a Tele or Face-to-Face Assessment?: Tele Assessment Is this an Initial Assessment or a Re-assessment for this encounter?: Initial Assessment Patient Accompanied by:: N/A Language Other than English: No Living Arrangements: Other (Comment)(has own home with wife and family) What gender do you identify as?: Male Marital status: Married Living Arrangements: Spouse/significant other, Children Can pt return to current living arrangement?: Yes Admission Status: Voluntary Is patient capable of signing voluntary admission?: Yes Referral Source: Self/Family/Friend Insurance type: [facility]     Crisis Care Plan Living Arrangements: Spouse/significant other, Children Legal Guardian: Other:(self) Name of Psychiatrist: none Name of Therapist: none  Education Status Is patient currently in school?: No Is the patient employed, unemployed or receiving disability?: Employed  Risk to self with the past 6 months Suicidal Ideation: Yes-Currently Present Has patient been a risk to self within the past 6 months prior to admission? : No Suicidal Intent: Yes-Currently Present Has patient had any suicidal intent within the past 6 months prior to admission? : No Is patient at risk for suicide?: Yes Suicidal Plan?: Yes-Currently Present Has patient had any suicidal plan within the past 6 months prior to admission? : No Specify Current Suicidal Plan: (overdose on Xanax) Access to Means: Yes Specify Access to Suicidal Means: (Xanax Overdose) What has been your use of drugs/alcohol within the last 12 months?: none Previous Attempts/Gestures: Yes How many times?: 3 Other Self Harm Risks:  (family conflict and unresolved grief from father's death) Triggers for Past Attempts: Unknown Intentional Self Injurious Behavior: None Family Suicide History: No Recent stressful life event(s): Conflict (Comment)(family) Persecutory voices/beliefs?: No Depression: Yes Depression Symptoms: Despondent, Insomnia, Tearfulness, Isolating, Loss of interest in usual pleasures, Feeling worthless/self pity Substance abuse history and/or treatment for substance abuse?: No Suicide prevention information given to non-admitted patients: Not applicable  Risk to Others within the past 6 months Homicidal Ideation: No Does patient have any lifetime risk of violence toward others beyond the six months prior to admission? : No Thoughts of Harm to Others: No Current Homicidal Intent: No Current Homicidal Plan: No Access to Homicidal Means: No Identified Victim: (none) History of harm to others?: No Assessment of Violence: None Noted Violent Behavior Description: (none reported) Does patient have access to weapons?: No Criminal Charges Pending?: No Does patient have a court date: No Is patient on probation?: No  Psychosis Hallucinations: Auditory(hears voices telling him to do  things) Delusions: None noted  Mental Status Report Appearance/Hygiene: Unremarkable Eye Contact: Poor Motor Activity: Freedom of movement Speech: Pressured Level of Consciousness: Alert Mood: Depressed, Apathetic Affect: Labile Anxiety Level: Moderate Thought Processes: Coherent, Relevant Judgement: Impaired Orientation: Person, Place, Time, Situation Obsessive Compulsive Thoughts/Behaviors: None  Cognitive Functioning Concentration: Decreased Memory: Recent Intact, Remote Intact Is patient IDD: No Insight: Poor Impulse Control: Poor Appetite: Poor Have you had any weight changes? : Loss Amount of the weight change? (lbs): 20 lbs Sleep: Decreased Total Hours of Sleep: (2-4) Vegetative Symptoms:  None  ADLScreening [facility] Assessment Services) Patient's cognitive ability adequate to safely complete daily activities?: Yes Patient able to express need for assistance with ADLs?: Yes Independently performs ADLs?: Yes (appropriate for developmental age)  Prior Inpatient Therapy Prior Inpatient Therapy: Yes Prior Therapy Dates: unsure of date Prior Therapy Facilty/Provider(s): unsure Reason for Treatment: detox off Chantix  Prior Outpatient Therapy Prior Outpatient Therapy: No Does patient have an ACCT team?: No Does patient have Intensive In-House Services?  : No Does patient have [facility] services? : No Does patient have P4CC services?: No  ADL Screening (condition at time of admission) Patient's cognitive ability adequate to safely complete daily activities?: Yes Is the patient deaf or have difficulty hearing?: No Does the patient have difficulty seeing, even when wearing glasses/contacts?: No Does the patient have difficulty concentrating, remembering, or making decisions?: No Patient able to express need for assistance with ADLs?: Yes Does the patient have difficulty dressing or bathing?: No Independently performs ADLs?: Yes (appropriate for developmental age) Does the patient have difficulty walking or climbing stairs?: No Weakness of Legs: None Weakness of Arms/Hands: None  Home Assistive Devices/Equipment Home Assistive Devices/Equipment: None  Therapy Consults (therapy consults require a physician order) PT Evaluation Needed: No OT Evalulation Needed: No SLP Evaluation Needed: No Abuse/Neglect Assessment (Assessment to be complete while patient is alone) Abuse/Neglect Assessment Can Be Completed: Yes Physical Abuse: Denies Verbal Abuse: Denies Sexual Abuse: Denies Exploitation of patient/patient's resources: Denies Self-Neglect: Denies Values / Beliefs Cultural Requests During Hospitalization: None Spiritual Requests During Hospitalization:  None Consults Spiritual Care Consult Needed: No Social Work Consult Needed: No Regulatory affairs officer (For Healthcare) Does Patient Have a Medical Advance Directive?: No Would patient like information on creating a medical advance directive?: No - Patient declined Nutrition Screen- [facility] Adult/[facility]/AP Has the patient recently lost weight without trying?: Yes, 14-23 lbs. Has the patient been eating poorly because of a decreased appetite?: Yes Malnutrition Screening Tool Score: 3        Disposition: Per Mordecai Maes, NP, patient meets inpatient admission criteria Disposition Initial Assessment Completed for this Encounter: Yes  This service was provided via telemedicine using a 2-way, interactive audio and video technology.  Names of all persons participating in this telemedicine service and their role in this encounter. Name: Evan Bauer Role: Patient  Name: Evan Bauer Role: TTS  Name:  Role:   Name:  Role:     Reatha Armour 02/04/2019 5:35 PM

## 2019-02-04 NOTE — ED Notes (Signed)
Per patient, he doesn't know how many xanax he took. States he doesn't take pills and he doesn't like pills and this was the only way out.

## 2019-02-04 NOTE — ED Notes (Signed)
TTS at bedside. 

## 2019-02-04 NOTE — ED Notes (Addendum)
Wife went to  this morning. Pt has been suffering with severe depression. Did write suicide note and took an unknown amount of xanax 0.5mg  . Wife made patient take Pristiq 50 mg, which is prescribed. Pt called mother in law and told her what he was doing and she called

## 2019-02-04 NOTE — ED Notes (Signed)
Pt's wallet given to security. Clothes, cellphone, shoes, and cigarettes were placed in EMS locker room.

## 2019-02-04 NOTE — ED Notes (Signed)
Per [facility], pt will be going to [facility]. Needs a COVID swab

## 2019-02-04 NOTE — ED Notes (Signed)
Pt states he doesn't want to be here, he wants to be with his daddy. When asked where his daddy was he stated "in his grave. I'm a failure."

## 2019-02-04 NOTE — ED Triage Notes (Signed)
Arrival via EMS , reports pt states he has had issues with depression since he took Chantix several years ago, took an unk number of Xanax this am. B/P 135/81 per EMS, O2 sat 98%

## 2019-02-05 DIAGNOSIS — F332 Major depressive disorder, recurrent severe without psychotic features: Principal | ICD-10-CM

## 2019-02-05 MED ORDER — NON FORMULARY: 1.0000 mg | Freq: Two times a day (BID) | Status: DC

## 2019-02-05 MED ORDER — ADULT MULTIVITAMIN W/MINERALS CH
1.0000 | ORAL_TABLET | Freq: Every day | ORAL | Status: DC
  Administered 2019-02-05 – 2019-02-06 (×2): 1 via ORAL
  Filled 2019-02-05 (×3): qty 1

## 2019-02-05 MED ORDER — DESVENLAFAXINE SUCCINATE ER 50 MG PO TB24
50.0000 mg | ORAL_TABLET | Freq: Every day | ORAL | Status: DC
  Administered 2019-02-05 – 2019-02-06 (×2): 50 mg via ORAL
  Filled 2019-02-05 (×4): qty 1

## 2019-02-05 MED ORDER — ZOLPIDEM TARTRATE 5 MG PO TABS: 10.0000 mg | ORAL_TABLET | Freq: Every day | ORAL | Status: DC

## 2019-02-05 MED ORDER — NON FORMULARY: 50.0000 mg | Freq: Every day | Status: DC

## 2019-02-05 MED ORDER — ENSURE ENLIVE PO LIQD: 237.0000 mL | ORAL | Status: DC

## 2019-02-05 MED ORDER — BREXPIPRAZOLE 1 MG PO TABS
1.0000 mg | ORAL_TABLET | Freq: Two times a day (BID) | ORAL | Status: DC
  Administered 2019-02-05 – 2019-02-06 (×3): 1 mg via ORAL
  Filled 2019-02-05 (×7): qty 1

## 2019-02-05 MED ORDER — PRENATAL MULTIVITAMIN CH
1.0000 | ORAL_TABLET | Freq: Every day | ORAL | Status: DC
  Administered 2019-02-06: 1 via ORAL
  Filled 2019-02-05 (×4): qty 1

## 2019-02-05 MED ORDER — ADULT MULTIVITAMIN W/MINERALS CH
ORAL_TABLET | ORAL | Status: AC
  Filled 2019-02-05: qty 1

## 2019-02-05 MED ORDER — ZOLPIDEM TARTRATE 5 MG PO TABS: 10.0000 mg | ORAL_TABLET | Freq: Every evening | ORAL | Status: DC | PRN

## 2019-02-05 NOTE — Progress Notes (Signed)
NUTRITION ASSESSMENT RD working remotely.   Pt identified as at risk on the Malnutrition Screen Tool  INTERVENTION: - will order Ensure Enlive once/day, each supplement provides 350 kcal and 20 grams of protein. - will order daily multivitamin with minerals. - continue to encourage PO intakes.   NUTRITION DIAGNOSIS: Unintentional weight loss related to sub-optimal intake as evidenced by pt report.   Goal: Pt to meet >/= 90% of their estimated nutrition needs.  Monitor:  PO intake  Assessment:  Patient admitted SI with OD in suicide attempt. Patient reports stressor is his in-laws. Patient reports that d/t stress he has not been sleeping and has had a decreased appetite with poor oral intakes. Per chart review, current weight is 204 lb and no other weight recorded in chart since 2015. Patient reports that he has lost 20 lb in the past few months. This would indicate 9% body weight in an unknown number of months.    43 y.o. male  Height: Ht Readings from Last 1 Encounters:  02/04/19 6\' 1"  (1.854 m)    Weight: Wt Readings from Last 1 Encounters:  02/04/19 92.5 kg    Weight Hx: Wt Readings from Last 10 Encounters:  02/04/19 92.5 kg  02/04/19 75 kg  01/26/14 99.8 kg  12/26/13 104.8 kg  12/08/13 105 kg  08/25/13 100.2 kg  07/26/12 93.4 kg  09/26/11 95.3 kg  12/06/10 98 kg  04/21/11 99.8 kg    BMI:  Body mass index is 26.91 kg/m. Pt meets criteria for overweight based on current BMI.  Estimated Nutritional Needs: Kcal: 25-30 kcal/kg Protein: > 1 gram protein/kg Fluid: 1 ml/kcal  Diet Order:  Diet Order            Diet regular Room service appropriate? Yes; Fluid consistency: Thin  Diet effective now             Pt is also offered choice of unit snacks mid-morning and mid-afternoon.  Pt is eating as desired.   Lab results and medications reviewed.      Jarome Matin, MS, RD, LDN, [facility] Inpatient Clinical Dietitian Pager # [phone] After  hours/weekend pager # [phone]

## 2019-02-05 NOTE — [facility] Counselor (Signed)
Adult Comprehensive Assessment  Patient ID: Evan Bauer, male   DOB: 08/15/1976, 43 y.o.   MRN: [id number]  Information Source: Information source: Patient  Current Stressors:  Patient states their primary concerns and needs for treatment are:: Pt states "They say I took pills and wrote a suicide letter" Pt reports he had missed 2-3 days where he did not take his medication. Pt identifies discord with his mother in law as a stressor. 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 Family Relationships: Pt reports discord with his mother in Sports coach, stating he feels "like she turned on his wifePublishing copy / Lack of resources (include bankruptcy): None Housing / Lack of housing: Stable housing Physical health (include injuries & life threatening diseases): None Social relationships: "I get along with everybody" Substance abuse: Pt denies,says he smokes cigarettes Bereavement / Loss: Father deceased in 2011-11-25  Living/Environment/Situation:  Living Arrangements: Spouse/significant other, Children Living conditions (as described by patient or guardian): "Good" Who else lives in the home?: Wife, children How long has patient lived in current situation?: 11-24-05 What is atmosphere in current home: Loving, Supportive  Family History:  Marital status: Married Number of Years Married: 59 What types of issues is patient dealing with in the relationship?: None reported Additional relationship information: N/A Are you sexually active?: No What is your sexual orientation?: Heterosexual Has your sexual activity been affected by drugs, alcohol, medication, or emotional stress?: None reported Does patient have children?: Yes How many children?: 2 How is patient's relationship with their children?: Pt reports having  children ages 64, 8, says he has "wonderful" relationship with them  Childhood History:  By whom was/is the patient raised?: Both parents Additional childhood history information: N/A Description of patient's relationship with caregiver when they were a child: "Somewhat good" Patient's description of current relationship with people who raised him/her: Father deceased. Pt reports his mother shows favortism toward his sister How were you disciplined when you got in trouble as a child/adolescent?: Whoopings Does patient have siblings?: Yes Number of Siblings: 1 Description of patient's current relationship with siblings: Pt states "I have very little contact with my sister" Did patient suffer any verbal/emotional/physical/sexual abuse as a child?: No Did patient suffer from severe childhood neglect?: No Has patient ever been sexually abused/assaulted/raped as an adolescent or adult?: No Was the patient ever a victim of a crime or a disaster?: No Witnessed domestic violence?: No Has patient been effected by domestic violence as an adult?: No  Education:  Highest grade of school patient has completed: Some college Currently a Ship broker?: No Learning disability?: No  Employment/Work Situation:   Employment situation: Employed Where is patient currently employed?: [place] How long has patient been employed?: 20 yrs Patient's job has been impacted by current illness: No What is the longest time patient has a held a job?: 20 yrs Where was the patient employed at that time?: [place] Did You Receive Any Psychiatric Treatment/Services While in the [employer]?: No Are There Guns or Other Weapons in [facility]?: No Are These Weapons Safely Secured?: (Pt denies access)  Financial Resources:   Financial resources: Income from employment Does patient have a representative payee or guardian?: No  Alcohol/Substance Abuse:   What has been your use of drugs/alcohol within the  last 12 months?: Pt denies If attempted  suicide, did drugs/alcohol play a role in this?: No Alcohol/Substance Abuse Treatment Hx: Denies past history If yes, describe treatment: N/A Has alcohol/substance abuse ever caused legal problems?: No  Social Support System:   Patient's Community Support System: Good Describe Community Support System: Wife, aunt, children, boss Type of faith/religion: Christian How does patient's faith help to cope with current illness?: "Prayer"  Leisure/Recreation:   Leisure and Hobbies: "Work"  Strengths/Needs:   What is the patient's perception of their strengths?: "Being with my kids" Patient states they can use these personal strengths during their treatment to contribute to their recovery: "Take my medicine" Patient states these barriers may affect/interfere with their treatment: None Patient states these barriers may affect their return to the community: None Other important information patient would like considered in planning for their treatment: N/A  Discharge Plan:   Currently receiving community mental health services: No Patient states concerns and preferences for aftercare planning are: Pt would like to be referred for outpatient treatment in [facility] Patient states they will know when they are safe and ready for discharge when: "I feel better, got my medication in me" Does patient have access to transportation?: Yes(Pt reports wife will pick up at discharge) Does patient have financial barriers related to discharge medications?: No Patient description of barriers related to discharge medications: N/A Will patient be returning to same living situation after discharge?: Yes  Summary/Recommendations:   Summary and Recommendations (to be completed by the evaluator): Pt is a 43 yr old male with a diagnosis of Depression recurrent severe without psychosis. Pt reports he was brought to the ED for overdose attempt and written suicide letter. Pt  states he had not been taking his medication as prescribed and believes this led to his recent hospitalization. Pt denies any current SI/HI.  Pt reports he does not have a mental health provider and would like to be referred for outpatient treatment in [place] denies any alcohol or drug use. While here, patient will benefit from crisis stabilization, medication evaluation, group therapy and psychoeducation. In addition, it is recommended that patient remain compliant with the established discharge plan and continue treatment.  Evan Bauer. 02/05/2019

## 2019-02-05 NOTE — Progress Notes (Signed)
[place] NOVEL CORONAVIRUS (COVID-19) DAILY CHECK-OFF SYMPTOMS - answer yes or no to each - every day NO YES  Have you had a fever in the past 24 hours?  . Fever (Temp > 37.80C / 100F) X   Have you had any of these symptoms in the past 24 hours? . New Cough .  Sore Throat  .  Shortness of Breath .  Difficulty Breathing .  Unexplained Body Aches   X   Have you had any one of these symptoms in the past 24 hours not related to allergies?   . Runny Nose .  Nasal Congestion .  Sneezing   X   If you have had runny nose, nasal congestion, sneezing in the past 24 hours, has it worsened?  X   EXPOSURES - check yes or no X   Have you traveled outside the state in the past 14 days?  X   Have you been in contact with someone with a confirmed diagnosis of COVID-19 or PUI in the past 14 days without wearing appropriate PPE?  X   Have you been living in the same home as a person with confirmed diagnosis of COVID-19 or a PUI (household contact)?    X   Have you been diagnosed with COVID-19?    X              What to do next: Answered NO to all: Answered YES to anything:   Proceed with unit schedule Follow the [facility] Inpatient Flowsheet.   

## 2019-02-05 NOTE — Tx Team (Signed)
Interdisciplinary Treatment and Diagnostic Plan Update  02/05/2019 Time of Session: [place] MRN: [id number]  Principal Diagnosis: <principal problem not specified>  Secondary Diagnoses: Active Problems:   MDD (major depressive disorder), recurrent episode, severe (HCC)   Current Medications:  Current Facility-Administered Medications  Medication Dose Route Frequency Provider Last Rate Last Dose  . albuterol (VENTOLIN HFA) 108 (90 Base) MCG/ACT inhaler 2 puff  2 puff Inhalation Q6H PRN Rankin, Shuvon B, NP      . Brexpiprazole TABS 1 mg  1 mg Oral BID Sharma Covert, MD      . desvenlafaxine (PRISTIQ) 24 hr tablet 50 mg  50 mg Oral Daily Sharma Covert, MD      . feeding supplement (ENSURE ENLIVE) (ENSURE ENLIVE) liquid 237 mL  237 mL Oral Q24H Sharma Covert, MD      . multivitamin with minerals tablet 1 tablet  1 tablet Oral Daily Sharma Covert, MD   1 tablet at 02/05/19 0906  . multivitamin with minerals tablet           . prenatal multivitamin tablet 1 tablet  1 tablet Oral Daily Johnn Hai, MD      . zolpidem Lorrin Mais) tablet 10 mg  10 mg Oral QHS PRN Johnn Hai, MD       PTA Medications: Medications Prior to Admission  Medication Sig Dispense Refill Last Dose  . desvenlafaxine (PRISTIQ) 50 MG 24 hr tablet Take 50 mg by mouth daily.     Marland Kitchen albuterol (VENTOLIN HFA) 108 (90 BASE) MCG/ACT inhaler Inhale 2 puffs into the lungs every 6 (six) hours as needed for wheezing. 1 Inhaler 0   . pantoprazole (PROTONIX) 40 MG tablet Take 1 tablet (40 mg total) by mouth 2 (two) times daily. 60 tablet 1     Patient Stressors: Marital or family conflict  Patient Strengths: Ability for insight Average or above average intelligence General fund of knowledge  Treatment Modalities: Medication Management, Group therapy, Case management,  1 to 1 session with clinician, Psychoeducation, Recreational therapy.   Physician Treatment Plan for Primary Diagnosis:  <principal problem not specified> Long Term Goal(s): Improvement in symptoms so as ready for discharge Improvement in symptoms so as ready for discharge   Short Term Goals: Ability to verbalize feelings will improve Ability to demonstrate self-control will improve Ability to identify and develop effective coping behaviors will improve Ability to maintain clinical measurements within normal limits will improve Compliance with prescribed medications will improve Ability to identify triggers associated with substance abuse/mental health issues will improve  Medication Management: Evaluate patient's response, side effects, and tolerance of medication regimen.  Therapeutic Interventions: 1 to 1 sessions, Unit Group sessions and Medication administration.  Evaluation of Outcomes: Not Met  Physician Treatment Plan for Secondary Diagnosis: Active Problems:   MDD (major depressive disorder), recurrent episode, severe ([place])  Long Term Goal(s): Improvement in symptoms so as ready for discharge Improvement in symptoms so as ready for discharge   Short Term Goals: Ability to verbalize feelings will improve Ability to demonstrate self-control will improve Ability to identify and develop effective coping behaviors will improve Ability to maintain clinical measurements within normal limits will improve Compliance with prescribed medications will improve Ability to identify triggers associated with substance abuse/mental health issues will improve     Medication Management: Evaluate patient's response, side effects, and tolerance of medication regimen.  Therapeutic Interventions: 1 to 1 sessions, Unit Group sessions and Medication administration.  Evaluation of Outcomes: Not  Met   RN Treatment Plan for Primary Diagnosis: <principal problem not specified> Long Term Goal(s): Knowledge of disease and therapeutic regimen to maintain health will improve  Short Term Goals: Ability to identify and  develop effective coping behaviors will improve and Compliance with prescribed medications will improve  Medication Management: RN will administer medications as ordered by provider, will assess and evaluate patient's response and provide education to patient for prescribed medication. RN will report any adverse and/or side effects to prescribing provider.  Therapeutic Interventions: 1 on 1 counseling sessions, Psychoeducation, Medication administration, Evaluate responses to treatment, Monitor vital signs and CBGs as ordered, Perform/monitor CIWA, COWS, AIMS and Fall Risk screenings as ordered, Perform wound care treatments as ordered.  Evaluation of Outcomes: Not Met   LCSW Treatment Plan for Primary Diagnosis: <principal problem not specified> Long Term Goal(s): Safe transition to appropriate next level of care at discharge, Engage patient in therapeutic group addressing interpersonal concerns.  Short Term Goals: Engage patient in aftercare planning with referrals and resources, Increase social support and Increase skills for wellness and recovery  Therapeutic Interventions: Assess for all discharge needs, 1 to 1 time with Social worker, Explore available resources and support systems, Assess for adequacy in community support network, Educate family and significant other(s) on suicide prevention, Complete Psychosocial Assessment, Interpersonal group therapy.  Evaluation of Outcomes: Not Met   Progress in Treatment: Attending groups: No. Participating in groups: No. Taking medication as prescribed: No. Toleration medication: No. Family/Significant other contact made: No, will contact:  when given permission Patient understands diagnosis: Yes. Discussing patient identified problems/goals with staff: Yes. Medical problems stabilized or resolved: Yes. Denies suicidal/homicidal ideation: Yes. Issues/concerns per patient self-inventory: No. Other: none  New problem(s) identified: No,  Describe:  none  New Short Term/Long Term Goal(s):  Patient Goals:  "start taking meds"  Discharge Plan or Barriers:   Reason for Continuation of Hospitalization: Depression Medication stabilization  Estimated Length of Stay:3-Attendees: Patient: 02/05/2019 10:13 AM  Physician: Dr. Mallie Darting, MD 02/05/2019 10:13 AM  Nursing: Baldo Daub, RN 02/05/2019 10:13 AM  RN Care Manager: 02/05/2019 10:13 AM  Social Worker: Lurline Idol, LCSW 02/05/2019 10:13 AM  Recreational Therapist:  02/05/2019 10:13 AM  Other:  02/05/2019 10:13 AM  Other:  02/05/2019 10:13 AM  Other: 02/05/2019 10:13 AM  5 days       Scribe for Treatment Team: Joanne Chars, [place] 02/05/2019 10:17 AM

## 2019-02-05 NOTE — Progress Notes (Signed)
[place] NOVEL CORONAVIRUS (COVID-19) DAILY CHECK-OFF SYMPTOMS - answer yes or no to each - every day NO YES  Have you had a fever in the past 24 hours?  . Fever (Temp > 37.80C / 100F) X   Have you had any of these symptoms in the past 24 hours? . New Cough .  Sore Throat  .  Shortness of Breath .  Difficulty Breathing .  Unexplained Body Aches   X   Have you had any one of these symptoms in the past 24 hours not related to allergies?   . Runny Nose .  Nasal Congestion .  Sneezing   X   If you have had runny nose, nasal congestion, sneezing in the past 24 hours, has it worsened?  X   EXPOSURES - check yes or no X   Have you traveled outside the state in the past 14 days?  X   Have you been in contact with someone with a confirmed diagnosis of COVID-19 or PUI in the past 14 days without wearing appropriate PPE?  X   Have you been living in the same home as a person with confirmed diagnosis of COVID-19 or a PUI (household contact)?    X   Have you been diagnosed with COVID-19?    X              What to do next: Answered NO to all: Answered YES to anything:   Proceed with unit schedule Follow the [facility] Inpatient Flowsheet.   

## 2019-02-05 NOTE — Progress Notes (Signed)
Adult Psychoeducational Group Note  Date:  02/05/2019 Time:  9:08 PM  Group Topic/Focus:  Wrap-Up Group:   The focus of this group is to help patients review their daily goal of treatment and discuss progress on daily workbooks.  Participation Level:  Active  Participation Quality:  Appropriate  Affect:  Appropriate  Cognitive:  Alert  Insight: Appropriate  Engagement in Group:  Engaged  Modes of Intervention:  Discussion  Additional Comments:  Pt rated day 10/10. His goal is to get back on medication and start to feel better.   Wynelle Fanny R 02/05/2019, 9:08 PM

## 2019-02-05 NOTE — Progress Notes (Addendum)
This is 2nd [facility] inpt admission for this 43yo male, voluntarily admitted from [place]. Pt overdosed on 4 Xanax 0.5mg , that were his wife's, and wrote a SI note. Pt reports that his main stressor is that his mother-in-law who lives next door, has been calling his wife names. Pt reports that the whole family is on the outs with one another and not speaking. Pt's father passed in 2019, and has not gotten over this loss. Pt reports smoking 3ppd of cigerattes currently, was prescribed Chantix in the past and had a adverse reaction. Pt has not been sleeping/eating well and has lost 20lbs in several months. Pt reports being married for 69yrs, wife is disabled, works for [employer] for 20 years, and has a 10yo autistic son, 78yo son, and a 47yo daughter. Family members have called CPS in the past, and they are trying to sell their home now because everyone lives on the same plot of land. Pt is unsure of medications he is on, and reports that his wife takes care of that. Pt denies SI/HI or hallucinations and reports that what he did was a stupid mistake and wants to go home.Hx asthma (a) 15 min checks (r) safety maintained.

## 2019-02-05 NOTE — Progress Notes (Signed)
Recreation Therapy Notes  Date:  6.17.20 Time: 0930 Location: [address] Dayroom  Group Topic: Stress Management  Goal Area(s) Addresses:  Patient will identify positive stress management techniques. Patient will identify benefits of using stress management post d/c.  Intervention:  Stress Management  Activity :  Meditation.  LRT introduced the stress management technique of meditation.  LRT played a meditation on letting go of the past and focusing on the present.  Patients were to follow along as the meditation was played to engage in activity.    Education:  Stress Management, Discharge Planning.   Education Outcome: Acknowledges Education  Clinical Observations/Feedback:  Pt did not attend group.     Victorino Sparrow, LRT/CTRS         Ria Comment, Plez Bel[place] A 02/05/2019 11:20 AM

## 2019-02-05 NOTE — H&P (Signed)
Psychiatric Admission Assessment Adult  Patient Identification: Evan Bauer MRN:  [id number] Date of Evaluation:  02/05/2019 Chief Complaint:  MDD Principal Diagnosis: Depression recurrent severe without psychosis Diagnosis:  Active Problems:   MDD (major depressive disorder), recurrent episode, severe (HCC)  History of Present Illness:   Mr. Evan Bauer is 43 years of age he is married and employed and normally highly functioning but he does have a depressive disorder, he had overdosed on only 2 mg of Xanax certainly a nonlethal amount, but had written a note feeling that his family would be better off without him.  There are several social stressors that are elaborated in previous assessments and not repeated here.  I phoned his wife with his encouragement to get collateral history.  She believes that he had responded to Pristiq when he took it regularly but has not been fully compliant, and recent stressors were overwhelming.  Further she is hopeful that we could take this opportunity to get him on a correct med regimen and encourage compliance.  The patient himself denies current suicidal thoughts is focused on and lobbying for discharge however he does acknowledge the need for med adjustments.  He is on 50 mg of Pristiq sporadically.  He may have withdrawal if he does not take it.  He has 1 prior hospitalization for similar circumstances but he insists that it was the use of Chantix that had led to depression/dangerousness so forth and that he felt that that was simply the medication that "messed him up" as far as inducing psychiatric symptomatology. Denies substance abuse Drug screen negative except for the alprazolam  Associated Signs/Symptoms: Depression Symptoms: Ruminations dysphoria anhedonia (Hypo) Manic Symptoms: Negative Anxiety Symptoms:  Excessive Worry, Psychotic Symptoms:  n/a PTSD Symptoms: NA Total Time spent with patient: 45 minutes  Past Psychiatric History: as  above  Is the patient at risk to self? Yes.    Has the patient been a risk to self in the past 6 months? No.  Has the patient been a risk to self within the distant past? No.  Is the patient a risk to others? No.  Has the patient been a risk to others in the past 6 months? No.  Has the patient been a risk to others within the distant past? No.   Prior Inpatient Therapy:   Prior Outpatient Therapy:    Alcohol Screening: 1. How often do you have a drink containing alcohol?: Never 2. How many drinks containing alcohol do you have on a typical day when you are drinking?: 1 or 2 3. How often do you have six or more drinks on one occasion?: Never AUDIT-C Score: 0 4. How often during the last year have you found that you were not able to stop drinking once you had started?: Never 5. How often during the last year have you failed to do what was normally expected from you becasue of drinking?: Never 6. How often during the last year have you needed a first drink in the morning to get yourself going after a heavy drinking session?: Never 7. How often during the last year have you had a feeling of guilt of remorse after drinking?: Never 8. How often during the last year have you been unable to remember what happened the night before because you had been drinking?: Never 9. Have you or someone else been injured as a result of your drinking?: No 10. Has a relative or friend or a doctor or another health worker been concerned about  your drinking or suggested you cut down?: No Alcohol Use Disorder Identification Test Final Score (AUDIT): 0 Alcohol Brief Interventions/Follow-up: AUDIT Score <7 follow-up not indicated Substance Abuse History in the last 12 months:  No. Consequences of Substance Abuse: NA Previous Psychotropic Medications: Yes  Psychological Evaluations: No  Past Medical History:  Past Medical History:  Diagnosis Date  . Allergy    RHINITIS  . Anxiety   . Asthma   . COPD (chronic  obstructive pulmonary disease) ([place])   . Depression   . GERD (gastroesophageal reflux disease)   . History of MRSA infection   . Smoker     Past Surgical History:  Procedure Laterality Date  . JOINT REPLACEMENT  11/2008   RIGHT SHOULDER  Noemi Chapel MD)   Family History:  Family History  Problem Relation Age of Onset  . Stroke Father   . Sarcoidosis Mother    Family Psychiatric  History: neg Tobacco Screening: Have you used any form of tobacco in the last 30 days? (Cigarettes, Smokeless Tobacco, Cigars, and/or Pipes): Yes Tobacco use, Select all that apply: 5 or more cigarettes per day Are you interested in Tobacco Cessation Medications?: No, patient refused Counseled patient on smoking cessation including recognizing danger situations, developing coping skills and basic information about quitting provided: Refused/Declined practical counseling Social History:  Social History   Substance and Sexual Activity  Alcohol Use No     Social History   Substance and Sexual Activity  Drug Use No    Additional Social History:      Pain Medications: pt denies History of alcohol / drug use?: No history of alcohol / drug abuse                    Allergies:   Allergies  Allergen Reactions  . Chantix [Varenicline Tartrate]     REACTION: suicidal/homicidal thoughts  . Levofloxacin     REACTION: closes throat  . Penicillins     REACTION: closes thorat   Lab Results:  Results for orders placed or performed during the hospital encounter of 02/04/19 (from the past 48 hour(s))  Comprehensive metabolic panel     Status: Abnormal   Collection Time: 02/04/19  2:44 PM  Result Value Ref Range   Sodium 139 135 - 145 mmol/L   Potassium 3.2 (L) 3.5 - 5.1 mmol/L   Chloride 106 98 - 111 mmol/L   CO2 22 22 - 32 mmol/L   Glucose, Bld 104 (H) 70 - 99 mg/dL   BUN 7 6 - 20 mg/dL   Creatinine, Ser 1.00 0.61 - 1.24 mg/dL   Calcium 8.8 (L) 8.9 - 10.3 mg/dL   Total Protein 7.0 6.5 - 8.1  g/dL   Albumin 4.0 3.5 - 5.0 g/dL   AST 15 15 - 41 U/L   ALT 18 0 - 44 U/L   Alkaline Phosphatase 79 38 - 126 U/L   Total Bilirubin 0.2 (L) 0.3 - 1.2 mg/dL   GFR calc non Af Amer >60 >60 mL/min   GFR calc Af Amer >60 >60 mL/min   Anion gap 11 5 - 15    Comment: Performed at [facility], [address], [place], [place] [address]  cbc     Status: None   Collection Time: 02/04/19  2:44 PM  Result Value Ref Range   WBC 10.2 4.0 - 10.5 K/uL   RBC 5.06 4.22 - 5.81 MIL/uL   Hemoglobin 15.6 13.0 - 17.0 g/dL   HCT 45.5 39.0 -  52.0 %   MCV 89.9 80.0 - 100.0 fL   MCH 30.8 26.0 - 34.0 pg   MCHC 34.3 30.0 - 36.0 g/dL   RDW 12.4 11.5 - 15.5 %   Platelets 286 150 - 400 K/uL   nRBC 0.0 0.0 - 0.2 %    Comment: Performed at [facility], [address] [place], [place] [address]  Ethanol     Status: None   Collection Time: 02/04/19  2:45 PM  Result Value Ref Range   Alcohol, Ethyl (B) <10 <10 mg/dL    Comment: (NOTE) Lowest detectable limit for serum alcohol is 10 mg/dL. For medical purposes only. Performed at [facility], [address], [place], [place] [address]   Salicylate level     Status: None   Collection Time: 02/04/19  2:45 PM  Result Value Ref Range   Salicylate Lvl <8.8 2.8 - 30.0 mg/dL    Comment: Performed at [facility], [address], [place], [place] [address]  Acetaminophen level     Status: Abnormal   Collection Time: 02/04/19  2:45 PM  Result Value Ref Range   Acetaminophen (Tylenol), Serum <10 (L) 10 - 30 ug/mL    Comment: (NOTE) Therapeutic concentrations vary significantly. A range of 10-30 ug/mL  may be an effective concentration for many patients. However, some  are best treated at concentrations outside of this range. Acetaminophen concentrations >150 ug/mL at 4 hours after ingestion  and >50 ug/mL at 12 hours after ingestion are often associated with  toxic reactions. Performed at [facility], [address], [place], [place] [address]   Rapid urine drug  screen (hospital performed)     Status: Abnormal   Collection Time: 02/04/19  3:20 PM  Result Value Ref Range   Opiates NONE DETECTED NONE DETECTED   Cocaine NONE DETECTED NONE DETECTED   Benzodiazepines POSITIVE (A) NONE DETECTED   Amphetamines NONE DETECTED NONE DETECTED   Tetrahydrocannabinol NONE DETECTED NONE DETECTED   Barbiturates NONE DETECTED NONE DETECTED    Comment: (NOTE) DRUG SCREEN FOR MEDICAL PURPOSES ONLY.  IF CONFIRMATION IS NEEDED FOR ANY PURPOSE, NOTIFY LAB WITHIN 5 DAYS. LOWEST DETECTABLE LIMITS FOR URINE DRUG SCREEN Drug Class                     Cutoff (ng/mL) Amphetamine and metabolites    1000 Barbiturate and metabolites    200 Benzodiazepine                 010 Tricyclics and metabolites     300 Opiates and metabolites        300 Cocaine and metabolites        300 THC                            50 Performed at [facility], [address] [place],  [address]   SARS Coronavirus 2 (CEPHEID - Performed in [place] hospital lab), Hosp Order     Status: None   Collection Time: 02/04/19  6:00 PM   Specimen: Nasopharyngeal Swab  Result Value Ref Range   SARS Coronavirus 2 NEGATIVE NEGATIVE    Comment: (NOTE) If result is NEGATIVE SARS-CoV-2 target nucleic acids are NOT DETECTED. The SARS-CoV-2 RNA is generally detectable in upper and lower  respiratory specimens during the acute phase of infection. The lowest  concentration of SARS-CoV-2 viral copies this assay can detect is 250  copies / mL.  A negative result does not preclude SARS-CoV-2 infection  and should not be used as the sole basis for treatment or other  patient management decisions.  A negative result may occur with  improper specimen collection / handling, submission of specimen other  than nasopharyngeal swab, presence of viral mutation(s) within the  areas targeted by this assay, and inadequate number of viral copies  (<250 copies / mL). A negative result must be combined with  clinical  observations, patient history, and epidemiological information. If result is POSITIVE SARS-CoV-2 target nucleic acids are DETECTED. The SARS-CoV-2 RNA is generally detectable in upper and lower  respiratory specimens dur ing the acute phase of infection.  Positive  results are indicative of active infection with SARS-CoV-2.  Clinical  correlation with patient history and other diagnostic information is  necessary to determine patient infection status.  Positive results do  not rule out bacterial infection or co-infection with other viruses. If result is PRESUMPTIVE POSTIVE SARS-CoV-2 nucleic acids MAY BE PRESENT.   A presumptive positive result was obtained on the submitted specimen  and confirmed on repeat testing.  While 2019 novel coronavirus  (SARS-CoV-2) nucleic acids may be present in the submitted sample  additional confirmatory testing may be necessary for epidemiological  and / or clinical management purposes  to differentiate between  SARS-CoV-2 and other Sarbecovirus currently known to infect humans.  If clinically indicated additional testing with an alternate test  methodology [phone]) is advised. The SARS-CoV-2 RNA is generally  detectable in upper and lower respiratory sp ecimens during the acute  phase of infection. The expected result is Negative. Fact Sheet for Patients:  StrictlyIdeas.no Fact Sheet for Healthcare Providers: BankingDealers.co.za This test is not yet approved or cleared by the [place] FDA and has been authorized for detection and/or diagnosis of SARS-CoV-2 by FDA under an Emergency Use Authorization (EUA).  This EUA will remain in effect (meaning this test can be used) for the duration of the COVID-19 declaration under Section 564(b)(1) of the Act, 21 U.S.C. section 360bbb-3(b)(1), unless the authorization is terminated or revoked sooner. Performed at [facility], [address],  [place], [place] [address]     Blood Alcohol level:  Lab Results  Component Value Date   ETH <10 02/04/2019   [facility]  08/02/2008    <5        LOWEST DETECTABLE LIMIT FOR SERUM ALCOHOL IS 5 mg/dL FOR MEDICAL PURPOSES ONLY    Metabolic Disorder Labs:  No results found for: HGBA1C, MPG No results found for: PROLACTIN No results found for: CHOL, TRIG, HDL, CHOLHDL, VLDL, LDLCALC  Current Medications: Current Facility-Administered Medications  Medication Dose Route Frequency Provider Last Rate Last Dose  . albuterol (VENTOLIN HFA) 108 (90 Base) MCG/ACT inhaler 2 puff  2 puff Inhalation Q6H PRN Rankin, Shuvon B, NP      . feeding supplement (ENSURE ENLIVE) (ENSURE ENLIVE) liquid 237 mL  237 mL Oral Q24H Sharma Covert, MD      . multivitamin with minerals tablet 1 tablet  1 tablet Oral Daily Sharma Covert, MD   1 tablet at 02/05/19 0906  . multivitamin with minerals tablet           . NON FORMULARY 1 mg  1 mg Oral BID Johnn Hai, MD      . NON FORMULARY 50 mg  50 mg Oral Q1200 Johnn Hai, MD      . prenatal vitamin w/FE, FA (NATACHEW) chewable tablet 1 tablet  1  tablet Oral Q1200 Johnn Hai, MD      . zolpidem Lorrin Mais) tablet 10 mg  10 mg Oral QHS Johnn Hai, MD       PTA Medications: Medications Prior to Admission  Medication Sig Dispense Refill Last Dose  . desvenlafaxine (PRISTIQ) 50 MG 24 hr tablet Take 50 mg by mouth daily.     Marland Kitchen albuterol (VENTOLIN HFA) 108 (90 BASE) MCG/ACT inhaler Inhale 2 puffs into the lungs every 6 (six) hours as needed for wheezing. 1 Inhaler 0   . pantoprazole (PROTONIX) 40 MG tablet Take 1 tablet (40 mg total) by mouth 2 (two) times daily. 60 tablet 1    Musculoskeletal: Strength & Muscle Tone: within normal limits Gait & Station: normal Patient leans: N/A  Psychiatric Specialty Exam: Physical Exam  ROS  Blood pressure 115/79, pulse 83, temperature 98 F (36.7 C), temperature source Oral, resp. rate 16, height 6\' 1"  (1.854 m), weight  92.5 kg.Body mass index is 26.91 kg/m.  General Appearance: Casual  Eye Contact:  Good  Speech:  Clear and Coherent  Volume:  Normal  Mood:  Anxious and Dysphoric  Affect:  Appropriate  Thought Process:  Coherent and Descriptions of Associations: Intact  Orientation:  Full (Time, Place, and Person)  Thought Content:  Logical  Suicidal Thoughts:  No  Homicidal Thoughts:  No  Memory:  Immediate;   Fair  Judgement:  Fair  Insight:  Fair  Psychomotor Activity:  NA and Normal  Concentration:  Concentration: Fair  Recall:  [place] of Knowledge:  Good  Language:  Good  Akathisia:  Negative  Handed:  Right  AIMS (if indicated):     Assets:  Communication Skills Desire for Improvement Financial Resources/Insurance Housing Physical Health Resilience Social Support Talents/Skills  ADL's:  Intact  Cognition:  WNL  Sleep:  Number of Hours: 6.5       Treatment Plan Summary: Daily contact with patient to assess and evaluate symptoms and progress in treatment and Medication management  Observation Level/Precautions:  15 minute checks  Laboratory:  UDS  Psychotherapy: Cognitive  Medications: Resume Pristiq augment with Rexulti as per request augment with B vitamins add Ambien for sleep  Consultations: None necessary  Discharge Concerns: Longer-term compliance  Estimated LOS: 2 days  Other: Axis I depression recurrent severe without psychosis   Physician Treatment Plan for Primary Diagnosis: <principal problem not specified> Long Term Goal(s): Improvement in symptoms so as ready for discharge  Short Term Goals: Ability to verbalize feelings will improve, Ability to demonstrate self-control will improve and Ability to identify and develop effective coping behaviors will improve  Physician Treatment Plan for Secondary Diagnosis: Active Problems:   MDD (major depressive disorder), recurrent episode, severe ([place])  Long Term Goal(s): Improvement in symptoms so as ready for  discharge  Short Term Goals: Ability to maintain clinical measurements within normal limits will improve, Compliance with prescribed medications will improve and Ability to identify triggers associated with substance abuse/mental health issues will improve  I certify that inpatient services furnished can reasonably be expected to improve the patient's condition.    Johnn Hai, MD 6/17/20209:24 AM

## 2019-02-05 NOTE — Progress Notes (Signed)
Nursing Note: 0700-1900  D:  Pt presents with anxious mood and affect. "I just need to get back home with my wife, she's my best friend and home is my safe place."   Pt states that he has been forgetting to take his medication as prescribed, "I'm just not used to taking pills."  Pt states that the Pristiq has been helpful when he was taking it, "I felt on top of the world when I was taking it.  I don't really want to die, I just want to go home to my wife."  Pt stays in room stating that this setting is not for him, "I'll do anything to get out of here."  A:  Encouraged to verbalize needs and concerns, active listening and support provided.  Continued Q 15 minute safety checks.  Observed active participation in group settings.  R:  Pt. is cooperative. Denies A/V hallucinations and is able to verbally contract for safety.

## 2019-02-05 NOTE — BHH Suicide Risk Assessment (Signed)
[facility] Admission Suicide Risk Assessment   Nursing information obtained from:  Patient Demographic factors:  Male, Caucasian Current Mental Status:  Suicidal ideation indicated by patient, Suicidal ideation indicated by others, Self-harm thoughts, Self-harm behaviors Loss Factors:  NA Historical Factors:  Impulsivity Risk Reduction Factors:  Employed, Living with another person, especially a relative, Positive social support  Total Time spent with patient: 45 minutes Principal Problem: Worsening depressive symptoms leading to nonlethal overdose, in the context of ongoing family stressors and noncompliance or partial compliance with Pristiq therapy which had been effective Diagnosis:  Active Problems:   MDD (major depressive disorder), recurrent episode, severe ([place])  Subjective Data: Acknowledges writing a note and overtaking Xanax but requesting discharge  Continued Clinical Symptoms:  Alcohol Use Disorder Identification Test Final Score (AUDIT): 0 The "Alcohol Use Disorders Identification Test", Guidelines for Use in Primary Care, Second Edition.  World Pharmacologist [facility]). Score between 0-7:  no or low risk or alcohol related problems. Score between 8-15:  moderate risk of alcohol related problems. Score between 16-19:  high risk of alcohol related problems. Score 20 or above:  warrants further diagnostic evaluation for alcohol dependence and treatment.   CLINICAL FACTORS:   Depression:   Impulsivity   Musculoskeletal: Strength & Muscle Tone: within normal limits Gait & Station: normal Patient leans: N/A  Psychiatric Specialty Exam: Physical Exam  ROS  Blood pressure 115/79, pulse 83, temperature 98 F (36.7 C), temperature source Oral, resp. rate 16, height 6\' 1"  (1.854 m), weight 92.5 kg.Body mass index is 26.91 kg/m.  General Appearance: Casual  Eye Contact:  Good  Speech:  Clear and Coherent  Volume:  Normal  Mood:  Anxious and Dysphoric  Affect:  Appropriate   Thought Process:  Coherent and Descriptions of Associations: Intact  Orientation:  Full (Time, Place, and Person)  Thought Content:  Logical  Suicidal Thoughts:  No  Homicidal Thoughts:  No  Memory:  Immediate;   Fair  Judgement:  Fair  Insight:  Fair  Psychomotor Activity:  NA and Normal  Concentration:  Concentration: Fair  Recall:  [place] of Knowledge:  Good  Language:  Good  Akathisia:  Negative  Handed:  Right  AIMS (if indicated):     Assets:  Communication Skills Desire for Improvement Financial Resources/Insurance [place] Talents/Skills  ADL's:  Intact  Cognition:  WNL  Sleep:  Number of Hours: 6.5      COGNITIVE FEATURES THAT CONTRIBUTE TO RISK:  None    SUICIDE RISK:   Minimal: No identifiable suicidal ideation.  Patients presenting with no risk factors but with morbid ruminations; may be classified as minimal risk based on the severity of the depressive symptoms  PLAN OF CARE: Monitor overnight augment antidepressant  I certify that inpatient services furnished can reasonably be expected to improve the patient's condition.   Johnn Hai, MD 02/05/2019, 9:21 AM

## 2019-02-06 MED ORDER — DESVENLAFAXINE SUCCINATE ER 50 MG PO TB24: 50.0000 mg | ORAL_TABLET | Freq: Every day | ORAL | 1 refills | Status: DC

## 2019-02-06 MED ORDER — ENLYTE PO CAPS: ORAL_CAPSULE | ORAL | 2 refills | Status: DC

## 2019-02-06 MED ORDER — ZOLPIDEM TARTRATE 10 MG PO TABS: 10.0000 mg | ORAL_TABLET | Freq: Every evening | ORAL | 0 refills | Status: DC | PRN

## 2019-02-06 MED ORDER — REXULTI 1 MG PO TABS: 1.0000 mg | ORAL_TABLET | Freq: Two times a day (BID) | ORAL | 2 refills | Status: DC

## 2019-02-06 NOTE — Progress Notes (Signed)
  [facility] Adult Case Management Discharge Plan :  Will you be returning to the same living situation after discharge:  Yes,  home At discharge, do you have transportation home?: Yes,  wife is picking up Do you have the ability to pay for your medications: No. Referred to [facility].  Release of information consent forms completed and in the chart. Letter on chart. Patient to Follow up at: Follow-up Information    Services, [facility] Follow up on 02/19/2019.   Why: Telephonic hospital follow up appointment is Wednesday, 7/1 at 9:00a. The provider will contact you.  Contact information: [address]  [place] [address] [phone]           Next level of care provider has access to [place] and Suicide Prevention discussed: Yes,  attempted to reach wife twice, SPE pamphlet on chart.  Have you used any form of tobacco in the last 30 days? (Cigarettes, Smokeless Tobacco, Cigars, and/or Pipes): Yes  Has patient been referred to the Quitline?: Patient refused referral  Patient has been referred for addiction treatment: Yes  Joellen Jersey, [place] 02/06/2019, 10:03 AM

## 2019-02-06 NOTE — Progress Notes (Signed)
Patient denied SI and HI, contracts for safety.  Denied A/V hallucinations.  Patient is looking forward to discharge today.

## 2019-02-06 NOTE — BHH Suicide Risk Assessment (Signed)
[place] INPATIENT:  Family/Significant Other Suicide Prevention Education  Suicide Prevention Education:  Contact Attempts: Evan Bauer, wife [phone] and [phone] has been identified by the patient as the family member/significant other with whom the patient will be residing, and identified as the person(s) who will aid the patient in the event of a mental health crisis.  With written consent from the patient, two attempts were made to provide suicide prevention education, prior to and/or following the patient's discharge.  We were unsuccessful in providing suicide prevention education.  A suicide education pamphlet was given to the patient to share with family/significant other.  Date and time of first attempt: 02/06/2019 at 9:50am Date and time of second attempt: 02/06/2019 at 9:55am  CSW called both numbers, left a HIPAA compliant VM with detailed callback information.  Joellen Jersey 02/06/2019, 10:00 AM

## 2019-02-06 NOTE — BHH Suicide Risk Assessment (Signed)
[name] INPATIENT:  Family/Significant Other Suicide Prevention Education  Suicide Prevention Education:  Education Completed; Evan Bauer, wife [phone]  has been identified by the patient as the family member/significant other with whom the patient will be residing, and identified as the person(s) who will aid the patient in the event of a mental health crisis (suicidal ideations/suicide attempt).  With written consent from the patient, the family member/significant other has been provided the following suicide prevention education, prior to the and/or following the discharge of the patient.  The suicide prevention education provided includes the following:  Suicide risk factors  Suicide prevention and interventions  National Suicide Hotline telephone number  [facility] assessment telephone number  [facility] Emergency Assistance [place] and/or Residential Mobile Crisis Unit telephone number  Request made of family/significant other to:  Remove weapons (e.g., guns, rifles, knives), all items previously/currently identified as safety concern.    Remove drugs/medications (over-the-counter, prescriptions, illicit drugs), all items previously/currently identified as a safety concern.  The family member/significant other verbalizes understanding of the suicide prevention education information provided.  The family member/significant other agrees to remove the items of safety concern listed above.  Evan Bauer 02/06/2019, 10:16 AM

## 2019-02-06 NOTE — Progress Notes (Signed)
Patient ID: Evan Bauer, male   DOB: 01/15/1976, 43 y.o.   MRN: [id number] D: Patient visible in the day room at start of shift interacting with his peers and watching tv.  Pt denied SI/HI/AVH at that time, verbalized readiness for discharge, reported that he has a great support system at home in his wife and two children.  A: Pt being maintained on Q15 minute checks for safety, took scheduled med and denied having any concerns.  R: Pt currently in bed and seems to be sleeping with no signs of distress, will continue to monitor on q15 minute checks.

## 2019-02-06 NOTE — Discharge Summary (Signed)
Physician Discharge Summary Note  Patient:  Evan Bauer is an 43 y.o., male MRN:  [id number] DOB:  04/03/1976 Patient phone:  [phone] (home)  Patient address:   [address] [place] [address],  Total Time spent with patient: 45 minutes  Date of Admission:  02/04/2019 Date of Discharge: 02/06/2019  Reason for Admission:   Evan Bauer is 43 years of age he is married and employed and normally highly functioning but he does have a depressive disorder, he had overdosed on only 2 mg of Xanax certainly a nonlethal amount, but had written a note feeling that his family would be better off without him.  There are several social stressors that are elaborated in previous assessments and not repeated here.  I phoned his wife with his encouragement to get collateral history.  She believes that he had responded to Pristiq when he took it regularly but has not been fully compliant, and recent stressors were overwhelming.  Further she is hopeful that we could take this opportunity to get him on a correct med regimen and encourage compliance.  The patient himself denies current suicidal thoughts is focused on and lobbying for discharge however he does acknowledge the need for med adjustments.  He is on 50 mg of Pristiq sporadically.  He may have withdrawal if he does not take it.  He has 1 prior hospitalization for similar circumstances but he insists that it was the use of Chantix that had led to depression/dangerousness so forth and that he felt that that was simply the medication that "messed him up" as far as inducing psychiatric symptomatology. Denies substance abuse Drug screen negative except for the alprazolam    Principal Problem: <principal problem not specified> Discharge Diagnoses: Active Problems:   MDD (major depressive disorder), recurrent episode, severe ([place])     Past Medical History:  Past Medical History:  Diagnosis Date  . Allergy    RHINITIS  . Anxiety   . Asthma    . COPD (chronic obstructive pulmonary disease) ([place])   . Depression   . GERD (gastroesophageal reflux disease)   . History of MRSA infection   . Smoker     Past Surgical History:  Procedure Laterality Date  . JOINT REPLACEMENT  11/2008   RIGHT SHOULDER  Evan Chapel MD)   Family History:  Family History  Problem Relation Age of Onset  . Stroke Father   . Sarcoidosis Mother    Social History:  Social History   Substance and Sexual Activity  Alcohol Use No     Social History   Substance and Sexual Activity  Drug Use No    Social History   Socioeconomic History  . Marital status: Married    Spouse name: Not on file  . Number of children: Not on file  . Years of education: Not on file  . Highest education level: Not on file  Occupational History  . Not on file  Social Needs  . Financial resource strain: Not on file  . Food insecurity    Worry: Not on file    Inability: Not on file  . Transportation needs    Medical: Not on file    Non-medical: Not on file  Tobacco Use  . Smoking status: Current Every Day Smoker    Packs/day: 3.00    Types: Cigarettes  . Smokeless tobacco: Never Used  Substance and Sexual Activity  . Alcohol use: No  . Drug use: No  . Sexual activity: Yes  Lifestyle  .  Physical activity    Days per week: Not on file    Minutes per session: Not on file  . Stress: Not on file  Relationships  . Social Herbalist on phone: Not on file    Gets together: Not on file    Attends religious service: Not on file    Active member of club or organization: Not on file    Attends meetings of clubs or organizations: Not on file    Relationship status: Not on file  Other Topics Concern  . Not on file  Social History Narrative  . Not on file    Hospital Course:   Once here the patient was very focused on discharge/missing employment so forth but he did agree to stay to allow for medication adjustments and hopefully get on the right regimen.   This was overall successful by the morning of the 18th he reported that he felt better he had been back on his regular meds Pristiq and we had augmented with Rexulti at his request.  He has no thoughts of harming self or others can contract fully is eager to get going.  Physical Findings: AIMS: Facial and Oral Movements Muscles of Facial Expression: None, normal Lips and Perioral Area: None, normal Jaw: None, normal Tongue: None, normal,Extremity Movements Upper (arms, wrists, hands, fingers): None, normal Lower (legs, knees, ankles, toes): None, normal, Trunk Movements Neck, shoulders, hips: None, normal, Overall Severity Severity of abnormal movements (highest score from questions above): None, normal Incapacitation due to abnormal movements: None, normal Patient's awareness of abnormal movements (rate only patient's report): No Awareness, Dental Status Current problems with teeth and/or dentures?: No Does patient usually wear dentures?: No  CIWA:    COWS:     Musculoskeletal: Strength & Muscle Tone: within normal limits Gait & Station: normal Psychiatric Specialty Exam: ROS  Blood pressure 115/79, pulse 83, temperature 98 F (36.7 C), temperature source Oral, resp. rate 16, height 6\' 1"  (1.854 m), weight 92.5 kg.Body mass index is 26.91 kg/m.  General Appearance: Casual  Eye Contact::  Good  Speech:  Clear and Coherent409  Volume:  Normal  Mood:  Euthymic  Affect:  Congruent  Thought Process:  Coherent and Descriptions of Associations: Intact  Orientation:  Full (Time, Place, and Person)  Thought Content:  Logical  Suicidal Thoughts:  No  Homicidal Thoughts:  No  Memory:  Recent;   Good  Judgement:  Good  Insight:  Good  Psychomotor Activity:  Normal  Concentration:  Good  Recall:  Good  Fund of Knowledge:Fair  Language: Good  Akathisia:  Negative  Handed:  Right  AIMS (if indicated):     Assets:  Communication Skills Desire for Improvement Financial  Resources/Insurance [place] Talents/Skills  Sleep:  Number of Hours: 6.75  Cognition: WNL  ADL's:  Intact    Have you used any form of tobacco in the last 30 days? (Cigarettes, Smokeless Tobacco, Cigars, and/or Pipes): Yes  Has this patient used any form of tobacco in the last 30 days? (Cigarettes, Smokeless Tobacco, Cigars, and/or Pipes) Yes, No  Blood Alcohol level:  Lab Results  Component Value Date   ETH <10 02/04/2019   [facility]  08/02/2008    <5        LOWEST DETECTABLE LIMIT FOR SERUM ALCOHOL IS 5 mg/dL FOR MEDICAL PURPOSES ONLY    Metabolic Disorder Labs:  No results found for: HGBA1C, MPG No results found for: PROLACTIN No results  found for: CHOL, TRIG, HDL, CHOLHDL, VLDL, LDLCALC  See Psychiatric Specialty Exam and Suicide Risk Assessment completed by Attending Physician prior to discharge.  Discharge destination:  Home  Is patient on multiple antipsychotic therapies at discharge:  No   Has Patient had three or more failed trials of antipsychotic monotherapy by history:  No  Recommended Plan for Multiple Antipsychotic Therapies: NA   Allergies as of 02/06/2019      Reactions   Chantix [varenicline Tartrate]    REACTION: suicidal/homicidal thoughts   Levofloxacin    REACTION: closes throat   Penicillins    REACTION: closes thorat      Medication List    TAKE these medications     Indication  albuterol 108 (90 Base) MCG/ACT inhaler Commonly known as: Ventolin HFA Inhale 2 puffs into the lungs every 6 (six) hours as needed for wheezing.  Indication: Asthma   desvenlafaxine 50 MG 24 hr tablet Commonly known as: PRISTIQ Take 1 tablet (50 mg total) by mouth daily.  Indication: Major Depressive Disorder   EnLyte Caps 1 a day- may get from [email or web address] if not on insurance formulary1  Indication: 21-Hydroxylase Deficiency, Deficiency of Folic Acid   pantoprazole 40 MG tablet Commonly known as: PROTONIX Take 1  tablet (40 mg total) by mouth 2 (two) times daily.  Indication: Gastroesophageal Reflux Disease   Rexulti 1 MG Tabs Generic drug: Brexpiprazole Take 1 tablet (1 mg total) by mouth 2 (two) times a day.  Indication: Major Depressive Disorder   zolpidem 10 MG tablet Commonly known as: AMBIEN Take 1 tablet (10 mg total) by mouth at bedtime as needed for sleep.  Indication: Trouble Sleeping      Follow-up Information    [facility] Follow up on 02/19/2019.   Why: Telephonic hospital follow up appointment is Wednesday, 7/1 at 9:00a. The provider will contact you.  Contact information: [place] [address] [phone]           Signed: Johnn Hai, MD 02/06/2019, 8:20 AM

## 2019-02-06 NOTE — BHH Suicide Risk Assessment (Signed)
[facility] Discharge Suicide Risk Assessment   Principal Problem: Major depressive disorder Discharge Diagnoses: Active Problems:   MDD (major depressive disorder), recurrent episode, severe ([place])   Total Time spent with patient: 45 minutes  Musculoskeletal: Strength & Muscle Tone: within normal limits Gait & Station: normal Psychiatric Specialty Exam: ROS  Blood pressure 115/79, pulse 83, temperature 98 F (36.7 C), temperature source Oral, resp. rate 16, height 6\' 1"  (1.854 m), weight 92.5 kg.Body mass index is 26.91 kg/m.  General Appearance: Casual  Eye Contact::  Good  Speech:  Clear and Coherent409  Volume:  Normal  Mood:  Euthymic  Affect:  Congruent  Thought Process:  Coherent and Descriptions of Associations: Intact  Orientation:  Full (Time, Place, and Person)  Thought Content:  Logical  Suicidal Thoughts:  No  Homicidal Thoughts:  No  Memory:  Recent;   Good  Judgement:  Good  Insight:  Good  Psychomotor Activity:  Normal  Concentration:  Good  Recall:  Good  Fund of Knowledge:Fair  Language: Good  Akathisia:  Negative  Handed:  Right  AIMS (if indicated):     Assets:  Communication Skills Desire for Improvement Financial Resources/Insurance Housing Physical Health Resilience Social Support Talents/Skills  Sleep:  Number of Hours: 6.75  Cognition: WNL  ADL's:  Intact   Mental Status Per Nursing Assessment::   On Admission:  Suicidal ideation indicated by patient, Suicidal ideation indicated by others, Self-harm thoughts, Self-harm behaviors  Demographic Factors:  Male  Loss Factors: NA  Historical Factors: NA  Risk Reduction Factors:   Responsible for children under 37 years of age, Sense of responsibility to family, Religious beliefs about death and Employed  Continued Clinical Symptoms:  Depression:   Severe  Cognitive Features That Contribute To Risk:  None    Suicide Risk:  Minimal: No identifiable suicidal ideation.  Patients  presenting with no risk factors but with morbid ruminations; may be classified as minimal risk based on the severity of the depressive symptoms  Follow-up Information    [facility] Follow up on 02/19/2019.   Why: Telephonic hospital follow up appointment is Wednesday, 7/1 at 9:00a. The provider will contact you.  Contact information: [place] [address] [phone]           Plan Of Care/Follow-up recommendations:  Activity:  full  Frona Yost, MD 02/06/2019, 8:00 AM

## 2019-02-06 NOTE — Progress Notes (Signed)
Patient ID: Evan Bauer, male   DOB: 07/26/1976, 43 y.o.   MRN: [id number] Patient discharged to home/self care in the presence of his wife.  Patient denies SI, HI and AVH upon discharge. Patient acknowledged understanding of all discharge instructions and receipt of personal belongings.

## 2019-02-20 ENCOUNTER — Encounter: Payer: 59 | Admitting: Gastroenterology

## 2019-04-30 ENCOUNTER — Other Ambulatory Visit: Payer: Self-pay

## 2019-04-30 ENCOUNTER — Other Ambulatory Visit: Payer: Self-pay | Admitting: Internal Medicine

## 2019-04-30 ENCOUNTER — Other Ambulatory Visit (HOSPITAL_COMMUNITY): Payer: Self-pay | Admitting: Internal Medicine

## 2019-04-30 ENCOUNTER — Ambulatory Visit (HOSPITAL_COMMUNITY)
Admission: RE | Admit: 2019-04-30 | Discharge: 2019-04-30 | Disposition: A | Discharge status: Home / Self Care | Payer: 59 | Source: Ambulatory Visit | Attending: Internal Medicine | Admitting: Internal Medicine

## 2019-04-30 DIAGNOSIS — R112 Nausea with vomiting, unspecified: Secondary | ICD-10-CM

## 2019-04-30 DIAGNOSIS — R1011 Right upper quadrant pain: Secondary | ICD-10-CM

## 2019-05-10 NOTE — Progress Notes (Signed)
Subjective:    Patient ID: Evan Bauer, male    DOB: 03-31-1976, 43 y.o.   MRN: [id number]  HPI Keevin Panebianco is a 43 year old male with a past medical history of anxiety, depression, asthma, COPD and GERD.  On Tuesday 04/28/2019 he was loading wood in his truck after work and he developed abrupt onset of vomiting.  He vomited a light brown liquid, not coffee-ground and no hematemesis.  He reported vomiting followed by dry heaves for about 10 episodes. He developed RUQ and RLQ abdominal pain. He went home and try to rest.  He developed mid chest pain later that evening and he felt as if his blood pressure was high. His blood pressure was 165/120.  His wife called 72 and EMS recommended ER evaluation, however, the patient declined.  He was seen by his PCP the next morning and laboratory studies and abdominal ultrasound were done.  The abdominal ultrasound 04/30/2019 identified a 4 mm gallbladder polyp and a dilated common bile duct measuring 7.6 mm without obvious cholelithiasis, the liver was normal.  WBC 7.7.  Hemoglobin 15.3.  Hematocrit 45.6.  MCV 92.  Platelets 285.  Glucose 100.  BUN 6.  Creatinine 1.06.  Sodium 142.  Potassium 4.1.  Albumin 4.4.  Total bili 0.3.  Alk phos 91.  AST 15.  ALT 18.  Amylase 55.  Lipase 17.  He was advised to schedule a GI consult.  He presents today accompanied by his wife.  He complains of having significant fatigue for the past 2 weeks.  He is unable to return to work due to this fatigue.  He reports having attention deficit disorder and typically is quite active.  He wishes to return to work as soon as possible.  His work requires significant physical activity and heavy lifting. He complains of having RUQ and RLQ abdominal pain which is constant. He denies having any fever, sweats or chills.  No weight loss.  He has gained 10 pounds since June 2020.  He is passing a normal brown bowel movement most days.  He intermittently sees bright red blood on the toilet  tissue and on the stool which has occurred intermittently over the past few years.  History of colon polyps, his last colonoscopy was 01/05/2014, see results below.  Complains of worsening heartburn.  He is taking pantoprazole 40 mg twice daily as his EGD in 2015 showed esophagitis.  No dysphasia.  No alcohol use.  He drinks 6 to 8 (16 ounce [employer] daily).  No drug use.  No family history of gastric or colon cancer.  EGD 01/05/2014 by Dr. Fuller Plan: LA Class B esophagitis Hiatal hernia  Colonoscopy 01/05/2014: 1. Two sessile polyps measuring 3-4 mm in the transverse colon; polypectomy performed with cold forceps 2. Sessile polyp measuring 6 mm in the sigmoid colon; polypectomy performed with a cold snare 3. Small internal hemorrhoids Biopsy results: - FRAGMENTS OF TUBULAR ADENOMAS (X2); NEGATIVE FOR HIGH GRADE DYSPLASIA OR MALIGNANCY. - HYPERPLASTIC POLYP (X1). Repeat colonoscopy in 5 years  Past Medical History:  Diagnosis Date   Allergy    RHINITIS   Anxiety    Asthma    COPD (chronic obstructive pulmonary disease) ([facility])    Depression    GERD (gastroesophageal reflux disease)    History of MRSA infection    Smoker    Past Surgical History:  Procedure Laterality Date   JOINT REPLACEMENT  11/2008   RIGHT SHOULDER  Noemi Chapel MD)   Current  Outpatient Medications on File Prior to Visit  Medication Sig Dispense Refill   acetaminophen (TYLENOL) 500 MG tablet Take 500 mg by mouth every 6 (six) hours as needed for moderate pain.     albuterol (VENTOLIN HFA) 108 (90 BASE) MCG/ACT inhaler Inhale 2 puffs into the lungs every 6 (six) hours as needed for wheezing. 1 Inhaler 0   Brexpiprazole (REXULTI) 1 MG TABS Take 1 tablet (1 mg total) by mouth 2 (two) times a day. 60 tablet 2   desvenlafaxine (PRISTIQ) 50 MG 24 hr tablet Take 1 tablet (50 mg total) by mouth daily. 90 tablet 1   ibuprofen (ADVIL) 200 MG tablet Take 200 mg by mouth every 6 (six) hours as needed for mild pain  or moderate pain. Patient states that he alternates between the Tylenol and ibuprofen     pantoprazole (PROTONIX) 40 MG tablet Take 1 tablet (40 mg total) by mouth 2 (two) times daily. 60 tablet 1   No current facility-administered medications on file prior to visit.    Allergies  Allergen Reactions   Chantix [Varenicline Tartrate]     REACTION: suicidal/homicidal thoughts   Levofloxacin     REACTION: closes throat   Penicillins     REACTION: closes thorat    Review of Systems see HPI     Objective:   Physical Exam  Blood pressure 134/88, pulse 89, temperature 98.2 F (36.8 C), temperature source Oral, height 6' 1"  (1.854 m), weight 218 lb 1.6 oz (98.9 kg). General: Well-developed male in no acute distress Eyes: Sclera nonicteric, conjunctiva pink Mouth: No ulcers or lesions Neck: Supple, no lymphadenopathy or thyromegaly Heart: Regular rate and rhythm, no murmurs Lungs: Breath sounds clear throughout Abdomen: Soft, nondistended, moderate right lower quadrant tenderness without rebound or guarding, less right upper quadrant abdominal tenderness without rebound or guarding, positive bowel sounds to all 4 quadrants, no HSM Extremities: No edema Neuro: Alert and oriented x4, no focal deficits     Assessment & Plan:    1. RUQ pain with abd sono showed a dilated CBD 7.15m without obvious cholelithiasis (normal lfts and lipase level) -repeat CBC, CMP, CRP and lipase levels -Patient to proceed with stat abd/pelvic CT, I will determine if MRCP required after CT results received  2. RLQ abdominal pain -see plan # 1 -Advised the patient to go to the local emergency room if his abdominal pain worsens  3. GERD -schedule EGD after the above evaluation completed -Continue Protonix 40 mg twice daily for now, may add Pepcid 20 mg at bedtime  4. Hx of colon polyps, rectal bleeding -Schedule colonoscopy after the above evaluation completed  Further instructions to be provided  after the lab results and abdominal/pelvic CT results received Patient to follow-up in the office in 1 week

## 2019-05-12 ENCOUNTER — Encounter (INDEPENDENT_AMBULATORY_CARE_PROVIDER_SITE_OTHER): Payer: Self-pay | Admitting: *Deleted

## 2019-05-12 ENCOUNTER — Ambulatory Visit (INDEPENDENT_AMBULATORY_CARE_PROVIDER_SITE_OTHER): Payer: 59 | Admitting: Nurse Practitioner

## 2019-05-12 ENCOUNTER — Encounter (INDEPENDENT_AMBULATORY_CARE_PROVIDER_SITE_OTHER): Payer: Self-pay | Admitting: Nurse Practitioner

## 2019-05-12 ENCOUNTER — Other Ambulatory Visit: Payer: Self-pay

## 2019-05-12 VITALS — BP 134/88 | HR 89 | Temp 98.2°F | Ht 73.0 in | Wt 218.1 lb

## 2019-05-12 DIAGNOSIS — R1011 Right upper quadrant pain: Secondary | ICD-10-CM | POA: Insufficient documentation

## 2019-05-12 DIAGNOSIS — K219 Gastro-esophageal reflux disease without esophagitis: Secondary | ICD-10-CM | POA: Diagnosis not present

## 2019-05-12 DIAGNOSIS — R1031 Right lower quadrant pain: Secondary | ICD-10-CM | POA: Diagnosis not present

## 2019-05-12 NOTE — Patient Instructions (Signed)
1.  CBC, CMP, CRP and lipase now  2.  Stat abdominal/pelvic CAT scan with oral and IV contrast  3.  Further structures to be provided after labs and abdominal/pelvic CT results received  4.  If your abdominal pain worsens please present to the local emergency room  5.  Follow-up in our office in 1 week, we will schedule EGD and colonoscopy at that time  6.  Work excuse for the next 48 hours

## 2019-05-13 ENCOUNTER — Ambulatory Visit (HOSPITAL_COMMUNITY)
Admission: RE | Admit: 2019-05-13 | Discharge: 2019-05-13 | Disposition: A | Discharge status: Home / Self Care | Payer: 59 | Source: Ambulatory Visit | Attending: Nurse Practitioner | Admitting: Nurse Practitioner

## 2019-05-13 DIAGNOSIS — R1031 Right lower quadrant pain: Secondary | ICD-10-CM

## 2019-05-13 DIAGNOSIS — R1011 Right upper quadrant pain: Secondary | ICD-10-CM | POA: Diagnosis present

## 2019-05-13 LAB — COMPLETE METABOLIC PANEL WITH GFR
AG Ratio: 1.8 (calc) (ref 1.0–2.5)
ALT: 21 U/L (ref 9–46)
AST: 15 U/L (ref 10–40)
Albumin: 4.4 g/dL (ref 3.6–5.1)
Alkaline phosphatase (APISO): 82 U/L (ref 36–130)
BUN: 11 mg/dL (ref 7–25)
CO2: 26 mmol/L (ref 20–32)
Calcium: 9.3 mg/dL (ref 8.6–10.3)
Chloride: 104 mmol/L (ref 98–110)
Creat: 1.01 mg/dL (ref 0.60–1.35)
GFR, Est African American: 107 mL/min/{1.73_m2} (ref >=60)
GFR, Est Non African American: 92 mL/min/{1.73_m2} (ref >=60)
Globulin: 2.4 g/dL (calc) (ref 1.9–3.7)
Glucose, Bld: 97 mg/dL (ref 65–139)
Potassium: 4 mmol/L (ref 3.5–5.3)
Sodium: 140 mmol/L (ref 135–146)
Total Bilirubin: 0.3 mg/dL (ref 0.2–1.2)
Total Protein: 6.8 g/dL (ref 6.1–8.1)

## 2019-05-13 LAB — C-REACTIVE PROTEIN: CRP: 3.3 mg/L (ref <8.0)

## 2019-05-13 LAB — CBC WITH DIFFERENTIAL/PLATELET
Absolute Monocytes: 835 cells/uL (ref 200–950)
Basophils Absolute: 29 cells/uL (ref 0–200)
Basophils Relative: 0.3 %
Eosinophils Absolute: 346 cells/uL (ref 15–500)
Eosinophils Relative: 3.6 %
HCT: 44.6 % (ref 38.5–50.0)
Hemoglobin: 15.5 g/dL (ref 13.2–17.1)
Lymphs Abs: 2957 cells/uL (ref 850–3900)
MCH: 31.2 pg (ref 27.0–33.0)
MCHC: 34.8 g/dL (ref 32.0–36.0)
MCV: 89.7 fL (ref 80.0–100.0)
MPV: 9.8 fL (ref 7.5–12.5)
Monocytes Relative: 8.7 %
Neutro Abs: 5434 cells/uL (ref 1500–7800)
Neutrophils Relative %: 56.6 %
Platelets: 324 10*3/uL (ref 140–400)
RBC: 4.97 10*6/uL (ref 4.20–5.80)
RDW: 12.4 % (ref 11.0–15.0)
Total Lymphocyte: 30.8 %
WBC: 9.6 10*3/uL (ref 3.8–10.8)

## 2019-05-13 LAB — LIPASE: Lipase: 24 U/L (ref 7–60)

## 2019-05-13 MED ORDER — IOHEXOL 300 MG/ML  SOLN
100.0000 mL | Freq: Once | INTRAMUSCULAR | Status: AC | PRN
  Administered 2019-05-13: 100 mL via INTRAVENOUS

## 2019-05-15 ENCOUNTER — Telehealth (INDEPENDENT_AMBULATORY_CARE_PROVIDER_SITE_OTHER): Payer: Self-pay | Admitting: *Deleted

## 2019-05-15 NOTE — Telephone Encounter (Signed)
Patient's wife called - they are wanting results of CT -- please call [phone]

## 2019-05-15 NOTE — Telephone Encounter (Signed)
report faxed to PCP  

## 2019-05-15 NOTE — Telephone Encounter (Signed)
I called wife, pt to follow up with PCP regarding abnormal L-S spine findings on CT. I advised pt to follow up back in our office after he sees his PCP.   Ann, pls fax a copy of pt's abd/pelvic CT to his PCP and mail a copy to patient thx

## 2019-06-11 ENCOUNTER — Ambulatory Visit (INDEPENDENT_AMBULATORY_CARE_PROVIDER_SITE_OTHER): Payer: 59 | Admitting: Nurse Practitioner

## 2019-10-22 ENCOUNTER — Other Ambulatory Visit: Payer: Self-pay | Admitting: Neurological Surgery

## 2019-10-27 NOTE — Progress Notes (Signed)
[id number] - [place], [place] [address] [address] Phone: [phone] Fax: [phone]      Your procedure is scheduled on Friday 10/31/2019.  Report to [facility] Main Entrance "A" at 11:30 A.M., and check in at the Admitting office.  Call this number if you have problems the morning of surgery:  [phone]  Call [phone] if you have any questions prior to your surgery date Monday-Friday 8am-4pm    Remember:  Do not eat or drink after midnight the night before your surgery    Take these medicines the morning of surgery with A SIP OF: Acetaminophen (Tylenol) - if needed Albuterol (Ventolin) inhaler - if needed Trintellix  7 days prior to surgery STOP taking any Aspirin (unless otherwise instructed by your surgeon), Aleve, Naproxen, Ibuprofen, Motrin, Advil, Goody's, BC's, all herbal medications, fish oil, and all vitamins.    The Morning of Surgery  Do not wear jewelry.  Do not wear lotions, powders, colognes, or deodorant  Do not shave 48 hours prior to surgery.  Men may shave face and neck.  Do not bring valuables to the hospital.  [facility] is not responsible for any belongings or valuables.  If you are a smoker, DO NOT Smoke 24 hours prior to surgery  If you wear a CPAP at night please bring your mask the morning of surgery   Remember that you must have someone to transport you home after your surgery, and remain with you for 24 hours if you are discharged the same day.   Please bring cases for contacts, glasses, hearing aids, dentures or bridgework because it cannot be worn into surgery.    Leave your suitcase in the car.  After surgery it may be brought to your room.  For patients admitted to the hospital, discharge time will be determined by your treatment team.  Patients discharged the day of surgery will not be allowed to drive home.    Special  instructions:   [facility]- Preparing For Surgery  Before surgery, you can play an important role. Because skin is not sterile, your skin needs to be as free of germs as possible. You can reduce the number of germs on your skin by washing with CHG (chlorahexidine gluconate) Soap before surgery.  CHG is an antiseptic cleaner which kills germs and bonds with the skin to continue killing germs even after washing.    Oral Hygiene is also important to reduce your risk of infection.  Remember - BRUSH YOUR TEETH THE MORNING OF SURGERY WITH YOUR REGULAR TOOTHPASTE  Please do not use if you have an allergy to CHG or antibacterial soaps. If your skin becomes reddened/irritated stop using the CHG.  Do not shave (including legs and underarms) for at least 48 hours prior to first CHG shower. It is OK to shave your face.  Please follow these instructions carefully.   1. Shower the NIGHT BEFORE SURGERY and the MORNING OF SURGERY with CHG Soap.   2. If you chose to wash your hair, wash your hair first as usual with your normal shampoo.  3. After you shampoo, rinse your hair and body thoroughly to remove the shampoo.  4. Use CHG as you would any other liquid soap. You can apply CHG directly to the skin and wash gently with a scrungie or a clean washcloth.   5. Apply the CHG Soap to  your body ONLY FROM THE NECK DOWN.  Do not use on open wounds or open sores. Avoid contact with your eyes, ears, mouth and genitals (private parts). Wash Face and genitals (private parts)  with your normal soap.   6. Wash thoroughly, paying special attention to the area where your surgery will be performed.  7. Thoroughly rinse your body with warm water from the neck down.  8. DO NOT shower/wash with your normal soap after using and rinsing off the CHG Soap.  9. Pat yourself dry with a CLEAN TOWEL.  10. Wear CLEAN PAJAMAS to bed the night before surgery, wear comfortable clothes the morning of surgery  11. Place CLEAN  SHEETS on your bed the night of your first shower and DO NOT SLEEP WITH PETS.    Day of Surgery:  Please shower the morning of surgery with the CHG soap Do not apply any deodorants/lotions. Please wear clean clothes to the hospital/surgery center.   Remember to brush your teeth WITH YOUR REGULAR TOOTHPASTE.   Please read over the following fact sheets that you were given.

## 2019-10-28 ENCOUNTER — Encounter (HOSPITAL_COMMUNITY): Payer: Self-pay

## 2019-10-28 ENCOUNTER — Ambulatory Visit (HOSPITAL_COMMUNITY)
Admission: RE | Admit: 2019-10-28 | Discharge: 2019-10-28 | Disposition: A | Discharge status: Home / Self Care | Payer: 59 | Source: Ambulatory Visit | Attending: Neurological Surgery | Admitting: Neurological Surgery

## 2019-10-28 ENCOUNTER — Encounter (HOSPITAL_COMMUNITY)
Admission: RE | Admit: 2019-10-28 | Discharge: 2019-10-28 | Disposition: A | Discharge status: Home / Self Care | Payer: 59 | Source: Ambulatory Visit | Attending: Neurological Surgery | Admitting: Neurological Surgery

## 2019-10-28 ENCOUNTER — Other Ambulatory Visit (HOSPITAL_COMMUNITY)
Admission: RE | Admit: 2019-10-28 | Discharge: 2019-10-28 | Disposition: A | Discharge status: Home / Self Care | Payer: 59 | Source: Ambulatory Visit | Attending: Neurological Surgery | Admitting: Neurological Surgery

## 2019-10-28 ENCOUNTER — Ambulatory Visit (HOSPITAL_COMMUNITY): Admission: RE | Admit: 2019-10-28 | Payer: 59 | Source: Ambulatory Visit

## 2019-10-28 ENCOUNTER — Other Ambulatory Visit: Payer: Self-pay

## 2019-10-28 DIAGNOSIS — M5126 Other intervertebral disc displacement, lumbar region: Secondary | ICD-10-CM | POA: Diagnosis present

## 2019-10-28 DIAGNOSIS — M5124 Other intervertebral disc displacement, thoracic region: Secondary | ICD-10-CM

## 2019-10-28 DIAGNOSIS — Z20822 Contact with and (suspected) exposure to covid-19: Secondary | ICD-10-CM | POA: Diagnosis not present

## 2019-10-28 HISTORY — DX: Other specified postprocedural states: R11.2

## 2019-10-28 HISTORY — DX: Other specified postprocedural states: Z98.890

## 2019-10-28 HISTORY — DX: Pneumonia, unspecified organism: J18.9

## 2019-10-28 LAB — CBC WITH DIFFERENTIAL/PLATELET
Abs Immature Granulocytes: 0.03 10*3/uL (ref 0.00–0.07)
Basophils Absolute: 0 10*3/uL (ref 0.0–0.1)
Basophils Relative: 0 %
Eosinophils Absolute: 0.2 10*3/uL (ref 0.0–0.5)
Eosinophils Relative: 3 %
HCT: 49.7 % (ref 39.0–52.0)
Hemoglobin: 16.7 g/dL (ref 13.0–17.0)
Immature Granulocytes: 0 %
Lymphocytes Relative: 30 %
Lymphs Abs: 2.2 10*3/uL (ref 0.7–4.0)
MCH: 30.9 pg (ref 26.0–34.0)
MCHC: 33.6 g/dL (ref 30.0–36.0)
MCV: 91.9 fL (ref 80.0–100.0)
Monocytes Absolute: 0.8 10*3/uL (ref 0.1–1.0)
Monocytes Relative: 11 %
Neutro Abs: 4.1 10*3/uL (ref 1.7–7.7)
Neutrophils Relative %: 56 %
Platelets: 340 10*3/uL (ref 150–400)
RBC: 5.41 MIL/uL (ref 4.22–5.81)
RDW: 11.9 % (ref 11.5–15.5)
WBC: 7.3 10*3/uL (ref 4.0–10.5)
nRBC: 0 % (ref 0.0–0.2)

## 2019-10-28 LAB — SURGICAL PCR SCREEN
MRSA, PCR: NEGATIVE
Staphylococcus aureus: POSITIVE — AB

## 2019-10-28 LAB — BASIC METABOLIC PANEL
Anion gap: 12 (ref 5–15)
BUN: 9 mg/dL (ref 6–20)
CO2: 24 mmol/L (ref 22–32)
Calcium: 9.2 mg/dL (ref 8.9–10.3)
Chloride: 103 mmol/L (ref 98–111)
Creatinine, Ser: 1.08 mg/dL (ref 0.61–1.24)
GFR calc Af Amer: >60 mL/min (ref >60)
GFR calc non Af Amer: >60 mL/min (ref >60)
Glucose, Bld: 86 mg/dL (ref 70–99)
Potassium: 3.9 mmol/L (ref 3.5–5.1)
Sodium: 139 mmol/L (ref 135–145)

## 2019-10-28 LAB — PROTIME-INR
INR: 1 (ref 0.8–1.2)
Prothrombin Time: 12.6 seconds (ref 11.4–15.2)

## 2019-10-28 NOTE — Progress Notes (Signed)
PCP - Dr. Allyn Kenner Cardiologist - patient denies  PPM/ICD - n/a Device Orders -  Rep Notified -   Chest x-ray - 10/28/19 EKG - 02/05/2019 Stress Test - patient denies ECHO - 2003 Cardiac Cath - patient denies  Sleep Study - patient denies CPAP -   Fasting Blood Sugar - n/a Checks Blood Sugar _____ times a day  Blood Thinner Instructions: n/a Aspirin Instructions: n/a  ERAS Protcol - n/a PRE-SURGERY Ensure or G2-   COVID TEST- after PAT appointment   Anesthesia review: n/a  Patient denies shortness of breath, fever, cough and chest pain at PAT appointment   All instructions explained to the patient, with a verbal understanding of the material. Patient agrees to go over the instructions while at home for a better understanding. Patient also instructed to self quarantine after being tested for COVID-19. The opportunity to ask questions was provided.

## 2019-10-29 LAB — SARS CORONAVIRUS 2 (TAT 6-24 HRS): SARS Coronavirus 2: NEGATIVE

## 2019-10-30 MED ORDER — VANCOMYCIN HCL 1500 MG/300ML IV SOLN
1500.0000 mg | INTRAVENOUS | Status: AC
  Administered 2019-10-31: 12:00:00 1500 mg via INTRAVENOUS
  Filled 2019-10-30: qty 300

## 2019-10-31 ENCOUNTER — Encounter (HOSPITAL_COMMUNITY)
Admission: RE | Disposition: A | Discharge status: Home / Self Care | Payer: Self-pay | Source: Home / Self Care | Attending: Neurological Surgery

## 2019-10-31 ENCOUNTER — Ambulatory Visit (HOSPITAL_COMMUNITY): Payer: 59 | Admitting: Certified Registered Nurse Anesthetist

## 2019-10-31 ENCOUNTER — Encounter (HOSPITAL_COMMUNITY): Payer: Self-pay | Admitting: Neurological Surgery

## 2019-10-31 ENCOUNTER — Ambulatory Visit (HOSPITAL_COMMUNITY): Payer: 59

## 2019-10-31 ENCOUNTER — Other Ambulatory Visit: Payer: Self-pay

## 2019-10-31 ENCOUNTER — Ambulatory Visit (HOSPITAL_COMMUNITY)
Admission: RE | Admit: 2019-10-31 | Discharge: 2019-10-31 | Disposition: A | Discharge status: Home / Self Care | Payer: 59 | Attending: Neurological Surgery | Admitting: Neurological Surgery

## 2019-10-31 DIAGNOSIS — M5127 Other intervertebral disc displacement, lumbosacral region: Secondary | ICD-10-CM | POA: Diagnosis not present

## 2019-10-31 DIAGNOSIS — J449 Chronic obstructive pulmonary disease, unspecified: Secondary | ICD-10-CM | POA: Diagnosis not present

## 2019-10-31 DIAGNOSIS — M5124 Other intervertebral disc displacement, thoracic region: Secondary | ICD-10-CM | POA: Insufficient documentation

## 2019-10-31 DIAGNOSIS — Z8614 Personal history of Methicillin resistant Staphylococcus aureus infection: Secondary | ICD-10-CM | POA: Insufficient documentation

## 2019-10-31 DIAGNOSIS — F17210 Nicotine dependence, cigarettes, uncomplicated: Secondary | ICD-10-CM | POA: Diagnosis not present

## 2019-10-31 DIAGNOSIS — Z419 Encounter for procedure for purposes other than remedying health state, unspecified: Secondary | ICD-10-CM

## 2019-10-31 DIAGNOSIS — M549 Dorsalgia, unspecified: Secondary | ICD-10-CM | POA: Diagnosis present

## 2019-10-31 HISTORY — PX: LUMBAR LAMINECTOMY/DECOMPRESSION MICRODISCECTOMY: SHX5026

## 2019-10-31 SURGERY — LUMBAR LAMINECTOMY/DECOMPRESSION MICRODISCECTOMY 2 LEVELS
Anesthesia: General | Site: Back | Laterality: Left

## 2019-10-31 MED ORDER — KETOROLAC TROMETHAMINE 30 MG/ML IJ SOLN
INTRAMUSCULAR | Status: DC | PRN
  Administered 2019-10-31: 30 mg via INTRAVENOUS

## 2019-10-31 MED ORDER — ONDANSETRON HCL 4 MG/2ML IJ SOLN
INTRAMUSCULAR | Status: DC | PRN
  Administered 2019-10-31: 4 mg via INTRAVENOUS

## 2019-10-31 MED ORDER — SODIUM CHLORIDE 0.9 % IV SOLN: INTRAVENOUS | Status: DC | PRN

## 2019-10-31 MED ORDER — PROPOFOL 500 MG/50ML IV EMUL
INTRAVENOUS | Status: DC | PRN
  Administered 2019-10-31: 75 ug/kg/min via INTRAVENOUS

## 2019-10-31 MED ORDER — MIDAZOLAM HCL 2 MG/2ML IJ SOLN
INTRAMUSCULAR | Status: AC
  Filled 2019-10-31: qty 2

## 2019-10-31 MED ORDER — SCOPOLAMINE 1 MG/3DAYS TD PT72
MEDICATED_PATCH | TRANSDERMAL | Status: DC | PRN
  Administered 2019-10-31: 1 via TRANSDERMAL

## 2019-10-31 MED ORDER — THROMBIN 20000 UNITS EX SOLR
CUTANEOUS | Status: AC
  Filled 2019-10-31: qty 20000

## 2019-10-31 MED ORDER — LIDOCAINE 2% (20 MG/ML) 5 ML SYRINGE
INTRAMUSCULAR | Status: DC | PRN
  Administered 2019-10-31: 40 mg via INTRAVENOUS

## 2019-10-31 MED ORDER — THROMBIN 5000 UNITS EX SOLR: OROMUCOSAL | Status: DC | PRN

## 2019-10-31 MED ORDER — HYDROMORPHONE HCL 1 MG/ML IJ SOLN
INTRAMUSCULAR | Status: AC
  Filled 2019-10-31: qty 0.5

## 2019-10-31 MED ORDER — DEXAMETHASONE SODIUM PHOSPHATE 10 MG/ML IJ SOLN
INTRAMUSCULAR | Status: AC
  Filled 2019-10-31: qty 1

## 2019-10-31 MED ORDER — HYDROMORPHONE HCL 1 MG/ML IJ SOLN
INTRAMUSCULAR | Status: DC | PRN
  Administered 2019-10-31 (×2): .5 mg via INTRAVENOUS

## 2019-10-31 MED ORDER — BUPIVACAINE HCL (PF) 0.25 % IJ SOLN
INTRAMUSCULAR | Status: AC
  Filled 2019-10-31: qty 30

## 2019-10-31 MED ORDER — PROPOFOL 10 MG/ML IV BOLUS
INTRAVENOUS | Status: DC | PRN
  Administered 2019-10-31: 200 mg via INTRAVENOUS

## 2019-10-31 MED ORDER — ACETAMINOPHEN 10 MG/ML IV SOLN
INTRAVENOUS | Status: AC
  Filled 2019-10-31: qty 100

## 2019-10-31 MED ORDER — 0.9 % SODIUM CHLORIDE (POUR BTL) OPTIME
TOPICAL | Status: DC | PRN
  Administered 2019-10-31: 1000 mL

## 2019-10-31 MED ORDER — CHLORHEXIDINE GLUCONATE CLOTH 2 % EX PADS: 6.0000 | MEDICATED_PAD | Freq: Once | CUTANEOUS | Status: DC

## 2019-10-31 MED ORDER — ACETAMINOPHEN 10 MG/ML IV SOLN
INTRAVENOUS | Status: DC | PRN
  Administered 2019-10-31: 1000 mg via INTRAVENOUS

## 2019-10-31 MED ORDER — DEXAMETHASONE SODIUM PHOSPHATE 10 MG/ML IJ SOLN
10.0000 mg | Freq: Once | INTRAMUSCULAR | Status: AC
  Administered 2019-10-31: 15:00:00 10 mg via INTRAVENOUS

## 2019-10-31 MED ORDER — LACTATED RINGERS IV SOLN: INTRAVENOUS | Status: DC | PRN

## 2019-10-31 MED ORDER — OXYCODONE HCL 5 MG/5ML PO SOLN: 5.0000 mg | Freq: Once | ORAL | Status: DC | PRN

## 2019-10-31 MED ORDER — MIDAZOLAM HCL 5 MG/5ML IJ SOLN
INTRAMUSCULAR | Status: DC | PRN
  Administered 2019-10-31: 2 mg via INTRAVENOUS

## 2019-10-31 MED ORDER — ONDANSETRON HCL 4 MG/2ML IJ SOLN
INTRAMUSCULAR | Status: AC
  Filled 2019-10-31: qty 2

## 2019-10-31 MED ORDER — SUGAMMADEX SODIUM 200 MG/2ML IV SOLN
INTRAVENOUS | Status: DC | PRN
  Administered 2019-10-31: 200 mg via INTRAVENOUS

## 2019-10-31 MED ORDER — LIDOCAINE 2% (20 MG/ML) 5 ML SYRINGE
INTRAMUSCULAR | Status: AC
  Filled 2019-10-31: qty 5

## 2019-10-31 MED ORDER — KETOROLAC TROMETHAMINE 30 MG/ML IJ SOLN
INTRAMUSCULAR | Status: AC
  Filled 2019-10-31: qty 1

## 2019-10-31 MED ORDER — SCOPOLAMINE 1 MG/3DAYS TD PT72
MEDICATED_PATCH | TRANSDERMAL | Status: AC
  Filled 2019-10-31: qty 1

## 2019-10-31 MED ORDER — ROCURONIUM BROMIDE 10 MG/ML (PF) SYRINGE
PREFILLED_SYRINGE | INTRAVENOUS | Status: AC
  Filled 2019-10-31: qty 10

## 2019-10-31 MED ORDER — FENTANYL CITRATE (PF) 250 MCG/5ML IJ SOLN
INTRAMUSCULAR | Status: AC
  Filled 2019-10-31: qty 5

## 2019-10-31 MED ORDER — DIPHENHYDRAMINE HCL 50 MG/ML IJ SOLN
INTRAMUSCULAR | Status: AC
  Filled 2019-10-31: qty 1

## 2019-10-31 MED ORDER — FENTANYL CITRATE (PF) 250 MCG/5ML IJ SOLN
INTRAMUSCULAR | Status: DC | PRN
  Administered 2019-10-31: 50 ug via INTRAVENOUS
  Administered 2019-10-31 (×2): 100 ug via INTRAVENOUS

## 2019-10-31 MED ORDER — FENTANYL CITRATE (PF) 100 MCG/2ML IJ SOLN: 25.0000 ug | INTRAMUSCULAR | Status: DC | PRN

## 2019-10-31 MED ORDER — GLYCOPYRROLATE PF 0.2 MG/ML IJ SOSY
PREFILLED_SYRINGE | INTRAMUSCULAR | Status: AC
  Filled 2019-10-31: qty 1

## 2019-10-31 MED ORDER — PROPOFOL 10 MG/ML IV BOLUS
INTRAVENOUS | Status: AC
  Filled 2019-10-31: qty 20

## 2019-10-31 MED ORDER — OXYCODONE HCL 5 MG PO TABS: 5.0000 mg | ORAL_TABLET | Freq: Once | ORAL | Status: DC | PRN

## 2019-10-31 MED ORDER — THROMBIN 5000 UNITS EX SOLR
CUTANEOUS | Status: AC
  Filled 2019-10-31: qty 5000

## 2019-10-31 MED ORDER — THROMBIN 20000 UNITS EX SOLR
OROMUCOSAL | Status: DC | PRN
  Administered 2019-10-31: 20 mL via TOPICAL

## 2019-10-31 MED ORDER — PHENYLEPHRINE 40 MCG/ML (10ML) SYRINGE FOR IV PUSH (FOR BLOOD PRESSURE SUPPORT)
PREFILLED_SYRINGE | INTRAVENOUS | Status: AC
  Filled 2019-10-31: qty 10

## 2019-10-31 MED ORDER — FENTANYL CITRATE (PF) 100 MCG/2ML IJ SOLN
INTRAMUSCULAR | Status: AC
  Filled 2019-10-31: qty 2

## 2019-10-31 MED ORDER — ROCURONIUM BROMIDE 10 MG/ML (PF) SYRINGE
PREFILLED_SYRINGE | INTRAVENOUS | Status: DC | PRN
  Administered 2019-10-31: 20 mg via INTRAVENOUS
  Administered 2019-10-31: 50 mg via INTRAVENOUS
  Administered 2019-10-31: 20 mg via INTRAVENOUS

## 2019-10-31 MED ORDER — OXYCODONE HCL 5 MG PO TABS: 5.0000 mg | ORAL_TABLET | ORAL | 0 refills | Status: DC | PRN

## 2019-10-31 MED ORDER — DIPHENHYDRAMINE HCL 50 MG/ML IJ SOLN
INTRAMUSCULAR | Status: DC | PRN
  Administered 2019-10-31: 25 mg via INTRAVENOUS

## 2019-10-31 MED ORDER — BUPIVACAINE HCL (PF) 0.25 % IJ SOLN
INTRAMUSCULAR | Status: DC | PRN
  Administered 2019-10-31: 4 mL

## 2019-10-31 MED ORDER — ONDANSETRON HCL 4 MG/2ML IJ SOLN: 4.0000 mg | Freq: Once | INTRAMUSCULAR | Status: DC | PRN

## 2019-10-31 SURGICAL SUPPLY — 47 items
APL SKNCLS STERI-STRIP NONHPOA (GAUZE/BANDAGES/DRESSINGS) ×1
BAG DECANTER FOR FLEXI CONT (MISCELLANEOUS) ×2 IMPLANT
BAND INSRT 18 STRL LF DISP RB (MISCELLANEOUS) ×2
BAND RUBBER #18 3X1/16 STRL (MISCELLANEOUS) ×4 IMPLANT
BENZOIN TINCTURE PRP APPL 2/3 (GAUZE/BANDAGES/DRESSINGS) ×2 IMPLANT
BUR CARBIDE MATCH 3.0 (BURR) ×2 IMPLANT
CANISTER SUCT 3000ML PPV (MISCELLANEOUS) ×2 IMPLANT
DRAPE LAPAROTOMY 100X72X124 (DRAPES) ×2 IMPLANT
DRAPE MICROSCOPE LEICA (MISCELLANEOUS) ×2 IMPLANT
DRAPE SURG 17X23 STRL (DRAPES) ×2 IMPLANT
DRSG OPSITE POSTOP 3X4 (GAUZE/BANDAGES/DRESSINGS) ×2 IMPLANT
DURAPREP 26ML APPLICATOR (WOUND CARE) ×2 IMPLANT
ELECT REM PT RETURN 9FT ADLT (ELECTROSURGICAL) ×2
ELECTRODE REM PT RTRN 9FT ADLT (ELECTROSURGICAL) ×1 IMPLANT
GAUZE 4X4 16PLY RFD (DISPOSABLE) IMPLANT
GLOVE BIO SURGEON STRL SZ 6.5 (GLOVE) ×3 IMPLANT
GLOVE BIO SURGEON STRL SZ7 (GLOVE) ×3 IMPLANT
GLOVE BIO SURGEON STRL SZ7.5 (GLOVE) ×2 IMPLANT
GLOVE BIO SURGEON STRL SZ8 (GLOVE) ×3 IMPLANT
GLOVE BIOGEL PI IND STRL 7.0 (GLOVE) IMPLANT
GLOVE BIOGEL PI IND STRL 7.5 (GLOVE) IMPLANT
GLOVE BIOGEL PI INDICATOR 7.0 (GLOVE) ×4
GLOVE BIOGEL PI INDICATOR 7.5 (GLOVE) ×2
GOWN STRL REUS W/ TWL LRG LVL3 (GOWN DISPOSABLE) IMPLANT
GOWN STRL REUS W/ TWL XL LVL3 (GOWN DISPOSABLE) ×1 IMPLANT
GOWN STRL REUS W/TWL LRG LVL3 (GOWN DISPOSABLE) ×4
GOWN STRL REUS W/TWL XL LVL3 (GOWN DISPOSABLE) ×6
HEMOSTAT POWDER KIT SURGIFOAM (HEMOSTASIS) ×1 IMPLANT
KIT BASIN OR (CUSTOM PROCEDURE TRAY) ×2 IMPLANT
KIT TURNOVER KIT B (KITS) ×2 IMPLANT
NDL HYPO 25X1 1.5 SAFETY (NEEDLE) ×1 IMPLANT
NDL SPNL 20GX3.5 QUINCKE YW (NEEDLE) IMPLANT
NEEDLE HYPO 25X1 1.5 SAFETY (NEEDLE) ×2 IMPLANT
NEEDLE SPNL 20GX3.5 QUINCKE YW (NEEDLE) ×6 IMPLANT
NS IRRIG 1000ML POUR BTL (IV SOLUTION) ×2 IMPLANT
PACK LAMINECTOMY NEURO (CUSTOM PROCEDURE TRAY) ×2 IMPLANT
PAD ARMBOARD 7.5X6 YLW CONV (MISCELLANEOUS) ×8 IMPLANT
SPONGE SURGIFOAM ABS GEL 100 (HEMOSTASIS) ×1 IMPLANT
SPONGE SURGIFOAM ABS GEL SZ50 (HEMOSTASIS) IMPLANT
STRIP CLOSURE SKIN 1/2X4 (GAUZE/BANDAGES/DRESSINGS) ×2 IMPLANT
SUT VIC AB 0 CT1 18XCR BRD8 (SUTURE) ×1 IMPLANT
SUT VIC AB 0 CT1 8-18 (SUTURE) ×2
SUT VIC AB 2-0 CP2 18 (SUTURE) ×2 IMPLANT
SUT VIC AB 3-0 SH 8-18 (SUTURE) ×2 IMPLANT
TOWEL GREEN STERILE (TOWEL DISPOSABLE) ×2 IMPLANT
TOWEL GREEN STERILE FF (TOWEL DISPOSABLE) ×2 IMPLANT
WATER STERILE IRR 1000ML POUR (IV SOLUTION) ×2 IMPLANT

## 2019-10-31 NOTE — H&P (Signed)
Subjective: Patient is a 44 y.o. male admitted for back pain. Onset of symptoms was several months ago, gradually worsening since that time.  The pain is rated severe, and is located at the across the lower back and radiates to L Leg and r flank. The pain is described as aching and occurs all day. The symptoms have been progressive. Symptoms are exacerbated by exercise. MRI or CT showed HNP L5-S1 L and T11-12 right   Past Medical History:  Diagnosis Date  . Allergy    RHINITIS  . Anxiety   . Asthma   . COPD (chronic obstructive pulmonary disease) ([place])   . Depression   . GERD (gastroesophageal reflux disease)   . History of MRSA infection   . Pneumonia   . PONV (postoperative nausea and vomiting)    "patches have worked in the past"  . Smoker     Past Surgical History:  Procedure Laterality Date  . ROTATOR CUFF REPAIR Left     Prior to Admission medications   Medication Sig Start Date End Date Taking? Authorizing Provider  acetaminophen (TYLENOL) 500 MG tablet Take 500 mg by mouth every 6 (six) hours as needed for moderate pain.   Yes [provider]  albuterol (VENTOLIN HFA) 108 (90 BASE) MCG/ACT inhaler Inhale 2 puffs into the lungs every 6 (six) hours as needed for wheezing. 01/08/12  Yes Tysinger, Camelia Eng, PA-C  TRINTELLIX 10 MG TABS tablet Take 10 mg by mouth daily. 10/16/19  Yes [provider]  Brexpiprazole (REXULTI) 1 MG TABS Take 1 tablet (1 mg total) by mouth 2 (two) times a day. Patient not taking: Reported on 10/24/2019 02/06/19   Johnn Hai, MD  desvenlafaxine (PRISTIQ) 50 MG 24 hr tablet Take 1 tablet (50 mg total) by mouth daily. Patient not taking: Reported on 10/24/2019 02/06/19   Johnn Hai, MD  pantoprazole (PROTONIX) 40 MG tablet Take 1 tablet (40 mg total) by mouth 2 (two) times daily. Patient not taking: Reported on 10/24/2019 08/26/13   Alfredia Ferguson, PA-C   Allergies  Allergen Reactions  . Chantix [Varenicline Tartrate] Other (See Comments)    REACTION: suicidal/homicidal thoughts  . Levofloxacin Anaphylaxis  . Penicillins Anaphylaxis    Did it involve swelling of the face/tongue/throat, SOB, or low BP? Yes Did it involve sudden or severe rash/hives, skin peeling, or any reaction on the inside of your mouth or nose? No Did you need to seek medical attention at a hospital or doctor's office? Yes When did it last happen?childhood If all above answers are "NO", may proceed with cephalosporin use.     Social History   Tobacco Use  . Smoking status: Current Every Day Smoker    Packs/day: 1.50    Types: Cigarettes  . Smokeless tobacco: Never Used  Substance Use Topics  . Alcohol use: No    Family History  Problem Relation Age of Onset  . Stroke Father   . Sarcoidosis Mother      Review of Systems  Positive ROS: neg  All other systems have been reviewed and were otherwise negative with the exception of those mentioned in the HPI and as above.  Objective: Vital signs in last 24 hours: Temp:  [98.3 F (36.8 C)] 98.3 F (36.8 C) (03/12 1128) Pulse Rate:  [80] 80 (03/12 1128) Resp:  [18] 18 (03/12 1128) BP: (130)/(83) 130/83 (03/12 1128) SpO2:  [97 %] 97 % (03/12 1128) Weight:  [99.8 kg] 99.8 kg (03/12 1128)  General Appearance: Alert,  cooperative, no distress, appears stated age Head: Normocephalic, without obvious abnormality, atraumatic Eyes: PERRL, conjunctiva/corneas clear, EOM's intact    Neck: Supple, symmetrical, trachea midline Back: Symmetric, no curvature, ROM normal, no CVA tenderness Lungs:  respirations unlabored Heart: Regular rate and rhythm Abdomen: Soft, non-tender Extremities: Extremities normal, atraumatic, no cyanosis or edema Pulses: 2+ and symmetric all extremities Skin: Skin color, texture, turgor normal, no rashes or lesions  NEUROLOGIC:   Mental status: Alert and oriented x4,  no aphasia, good attention span, fund of knowledge, and memory Motor Exam - grossly normal Sensory  Exam - grossly normal Reflexes: 1+ Coordination - grossly normal Gait - grossly normal Balance - grossly normal Cranial Nerves: I: smell Not tested  II: visual acuity  OS: nl    OD: nl  II: visual fields Full to confrontation  II: pupils Equal, round, reactive to light  III,VII: ptosis None  III,IV,VI: extraocular muscles  Full ROM  V: mastication Normal  V: facial light touch sensation  Normal  V,VII: corneal reflex  Present  VII: facial muscle function - upper  Normal  VII: facial muscle function - lower Normal  VIII: hearing Not tested  IX: soft palate elevation  Normal  IX,X: gag reflex Present  XI: trapezius strength  5/5  XI: sternocleidomastoid strength 5/5  XI: neck flexion strength  5/5  XII: tongue strength  Normal    Data Review Lab Results  Component Value Date   WBC 7.3 10/28/2019   HGB 16.7 10/28/2019   HCT 49.7 10/28/2019   MCV 91.9 10/28/2019   PLT 340 10/28/2019   Lab Results  Component Value Date   NA 139 10/28/2019   K 3.9 10/28/2019   CL 103 10/28/2019   CO2 24 10/28/2019   BUN 9 10/28/2019   CREATININE 1.08 10/28/2019   GLUCOSE 86 10/28/2019   Lab Results  Component Value Date   INR 1.0 10/28/2019    Assessment/Plan:  Estimated body mass index is 32.49 kg/m as calculated from the following:   Height as of this encounter: 5\' 9"  (1.753 m).   Weight as of this encounter: 99.8 kg. Patient admitted for L L5-S1 microdiskectomy, R T11-12 transpedicular diskectomy. Patient has failed a reasonable attempt at conservative therapy.  I explained the condition and procedure to the patient and answered any questions.  Patient wishes to proceed with procedure as planned. Understands risks/ benefits and typical outcomes of procedure.   Eustace Moore 10/31/2019 2:21 PM

## 2019-10-31 NOTE — Transfer of Care (Signed)
Immediate Anesthesia Transfer of Care Note  Patient: Evan Bauer  Procedure(s) Performed: Left Lumbar Five-Sacral One Microdiscectomy, Right Thoracic Eleven-Twelve Transpedicular Diskectomy (Left Back)  Patient Location: PACU  Anesthesia Type:General  Level of Consciousness: drowsy and patient cooperative  Airway & Oxygen Therapy: Patient Spontanous Breathing and Patient connected to nasal cannula oxygen  Post-op Assessment: Report given to RN and Post -op Vital signs reviewed and stable  Post vital signs: Reviewed and stable  Last Vitals:  Vitals Value Taken Time  BP 137/91 10/31/19 1711  Temp    Pulse 75 10/31/19 1718  Resp 14 10/31/19 1718  SpO2 88 % 10/31/19 1718  Vitals shown include unvalidated device data.  Last Pain:  Vitals:   10/31/19 1209  TempSrc:   PainSc: 5       Patients Stated Pain Goal: 5 ([phone])  Complications: No apparent anesthesia complications

## 2019-10-31 NOTE — Anesthesia Procedure Notes (Signed)

## 2019-10-31 NOTE — Anesthesia Preprocedure Evaluation (Signed)
Anesthesia Evaluation  Patient identified by MRN, date of birth, ID band Patient awake    Reviewed: Allergy & Precautions, NPO status , Patient's Chart, lab work & pertinent test results  Airway Mallampati: II  TM Distance: >3 FB Neck ROM: Full    Dental  (+) Teeth Intact, Dental Advisory Given   Pulmonary Current Smoker,    breath sounds clear to auscultation       Cardiovascular  Rhythm:Regular Rate:Normal     Neuro/Psych    GI/Hepatic   Endo/Other    Renal/GU      Musculoskeletal   Abdominal (+) + obese,   Peds  Hematology   Anesthesia Other Findings   Reproductive/Obstetrics                             Anesthesia Physical Anesthesia Plan  ASA: II  Anesthesia Plan: General   Post-op Pain Management:    Induction: Intravenous  PONV Risk Score and Plan: Ondansetron and Dexamethasone  Airway Management Planned: Oral ETT  Additional Equipment:   Intra-op Plan:   Post-operative Plan: Extubation in OR  Informed Consent: I have reviewed the patients History and Physical, chart, labs and discussed the procedure including the risks, benefits and alternatives for the proposed anesthesia with the patient or authorized representative who has indicated his/her understanding and acceptance.     Dental advisory given  Plan Discussed with: CRNA and Anesthesiologist  Anesthesia Plan Comments:         Anesthesia Quick Evaluation

## 2019-10-31 NOTE — Op Note (Signed)
10/31/2019  5:02 PM  PATIENT:  Evan Bauer  44 y.o. male  PRE-OPERATIVE DIAGNOSIS: 1.  Right T11-12 herniated nucleus pulposus with back and right flank pain, 2.  Left L5-S1 herniated nucleus pulposus with back and left leg pain  POST-OPERATIVE DIAGNOSIS:  same  PROCEDURE:  1.  Right T11-12 hemilaminectomy medial facetectomy and transpedicular microdiscectomy utilizing microscopic dissection, 2.  Left L5-S1 hemilaminectomy medial facetectomy foraminotomies followed by microdiscectomy utilizing microscopic dissection  SURGEON:  Sherley Bounds, MD  ASSISTANTS: Dr. Ashok Pall  ANESTHESIA:   General  EBL: 100 ml  Total I/O In: 1100 [I.V.:1000; IV Piggyback:100] Out: 100 [Blood:100]  BLOOD ADMINISTERED: none  DRAINS: None  SPECIMEN:  none  INDICATION FOR PROCEDURE: This patient presented with back pain in the thoracolumbar region which radiated around his rib cage to the right and back and left leg pain down the back of the left leg.. Imaging showed herniated disc at T11-12 on the right and L5 S1 on the left. The patient tried conservative measures without relief. Pain was debilitating. Recommended a right T11-12 transpedicular discectomy and a left L5-S1 microdiscectomy in hopes of improvement of his pain syndrome. Patient understood the risks, benefits, and alternatives and potential outcomes and wished to proceed.  PROCEDURE DETAILS: The patient was taken to the operating room and after induction of adequate generalized endotracheal anesthesia, the patient was rolled into the prone position on the Wilson frame and all pressure points were padded. The lumbar region and thoracolumbar region was cleaned and then prepped with DuraPrep and draped in the usual sterile fashion.  I marked my incisions by placing a needle at T11-12 and L5-S1.  Lateral x-ray confirmed my location and a marked for my incisions.  5 cc of local anesthesia was injected and then a dorsal midline incision was made  and carried down to the lumbo sacral fascia over L5-S1 and a midline incision was made over T11-12 also and carried down to the fascia.. The fascia was opened and the paraspinous musculature was taken down in a subperiosteal fashion to expose L5-S1 on the left and T11-12 on the right. Intraoperative x-ray confirmed my level, and then I used a combination of the high-speed drill and the Kerrison punches to perform a hemilaminectomy, medial facetectomy, and foraminotomy at L5-S1 on the left. The underlying yellow ligament was opened and removed in a piecemeal fashion to expose the underlying dura and exiting nerve root. I undercut the lateral recess and dissected down until I was medial to and distal to the pedicle. The nerve root was well decompressed. We then gently retracted the nerve root medially with a retractor, coagulated the epidural venous vasculature, and incised the disc space. I performed a thorough intradiscal discectomy with pituitary rongeurs and curettes, until I had a nice decompression of the nerve root and the midline.  There was a fragment superior to the disc space in the midline which was calcified and stuck.  I was able to tease this away from the underside of the dura and remove it by pushing it down in the disc space.  I then palpated with a coronary dilator along the nerve root and into the foramen to assure adequate decompression. I felt no more compression of the nerve root.  I then turned my attention to the T11-12 transpedicular discectomy on the right.  Used a high-speed drill to perform a hemilaminectomy and medial facetectomy at T11-12 on the right.  Dissected down and found the medial pedicle wall and then  drilled the superior medial part of the pedicle and got lateral to the dura.  I was then able to coagulated the epidural venous vasculature and incised the disc utilizing microscopic dissection.  I then was able to push the midline disc protrusion into the disc space with a Epstein  curette and remove large amounts of disc from the midline.  Once the discectomy is complete I could palpate under the dura and the annulus felt flat.  I felt no compression of the cord.  I was careful not to manipulate the dura or the cord during the decompression.  I was careful to push things down into the disc space.  I irrigated with saline solution containing bacitracin. Achieved hemostasis with bipolar cautery, lined the dura with Gelfoam, and then closed the fascia at both levels with 0 Vicryl. I closed the subcutaneous tissues with 2-0 Vicryl and the subcuticular tissues with 3-0 Vicryl at both levels. The skin was then closed with benzoin and Steri-Strips. The drapes were removed, a sterile dressing was applied.  My nurse practitioner and then Dr. Christella Noa was involved in the exposure, safe retraction of the neural elements, the disc work and the closure. the patient was awakened from general anesthesia and transferred to the recovery room in stable condition. At the end of the procedure all sponge, needle and instrument counts were correct.    PLAN OF CARE: Discharge to home after PACU  PATIENT DISPOSITION:  PACU - hemodynamically stable.   Delay start of Pharmacological VTE agent (>24hrs) due to surgical blood loss or risk of bleeding:  yes

## 2019-11-01 NOTE — Anesthesia Postprocedure Evaluation (Signed)
Anesthesia Post Note  Patient: Evan Bauer  Procedure(s) Performed: Left Lumbar Five-Sacral One Microdiscectomy, Right Thoracic Eleven-Twelve Transpedicular Diskectomy (Left Back)     Patient location during evaluation: PACU Anesthesia Type: General Level of consciousness: awake and alert Pain management: pain level controlled Vital Signs Assessment: post-procedure vital signs reviewed and stable Respiratory status: spontaneous breathing, nonlabored ventilation, respiratory function stable and patient connected to nasal cannula oxygen Cardiovascular status: blood pressure returned to baseline and stable Postop Assessment: no apparent nausea or vomiting Anesthetic complications: no    Last Vitals:  Vitals:   10/31/19 1726 10/31/19 1740  BP: 117/74 111/72  Pulse: 80 81  Resp: 13 13  Temp:  36.4 C  SpO2: 99% 99%    Last Pain:  Vitals:   10/31/19 1740  TempSrc:   PainSc: Asleep                 Denessa Cavan COKER

## 2020-08-02 IMAGING — CT CT ABD-PELV W/ CM
2 of 5 series · 16 of 46 positions shown, 18 images · IV contrast (omnipaque)
Comparison: 08/26/2013

CLINICAL DATA: Right upper quadrant and right lower current
abdominal pain question appendicitis

EXAM:
CT ABDOMEN AND PELVIS WITH CONTRAST
TECHNIQUE: Multidetector CT imaging of the abdomen and pelvis was performed
using the standard protocol following bolus administration of
intravenous contrast. Sagittal and coronal MPR images reconstructed
from axial data set.
CONTRAST:  100mL OMNIPAQUE IOHEXOL 300 MG/ML SOLN IV. Dilute oral
contrast.

[Series 3: a/p w/ 5mm · axial · 0.95mm/px · z∈[+994,+1474]mm · 13 of 109 slices shown, 15 images]
[im 7/109  soft-tissue]
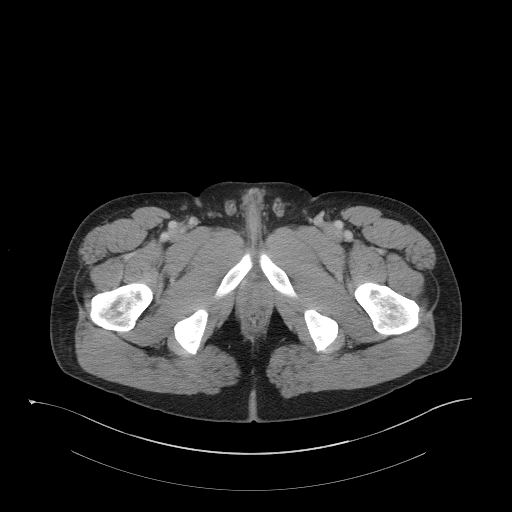
[im 7/109  bone]
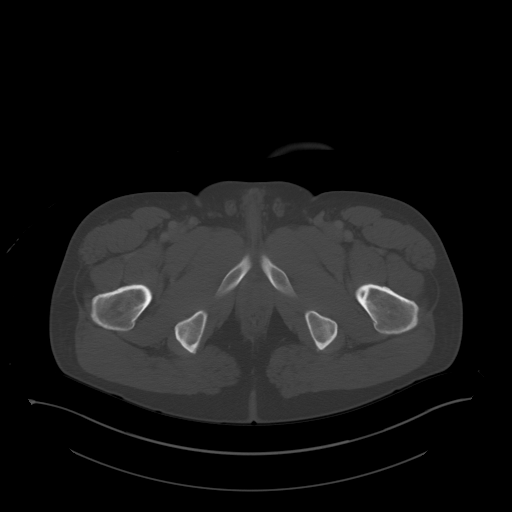
[im 13/109  soft-tissue]
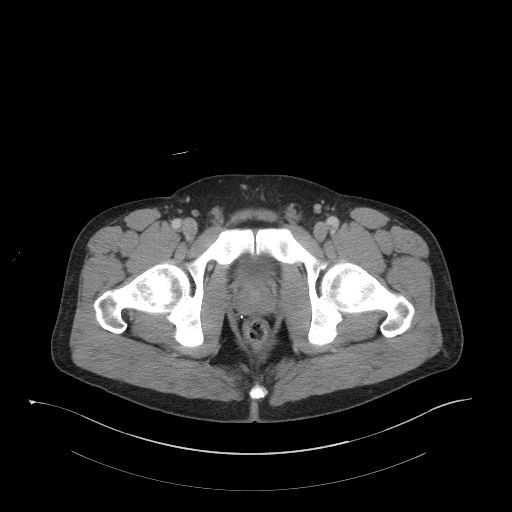
[im 25/109  soft-tissue]
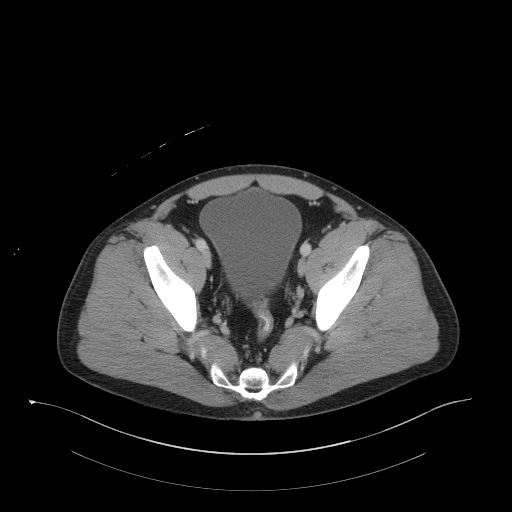
[im 31/109  soft-tissue]
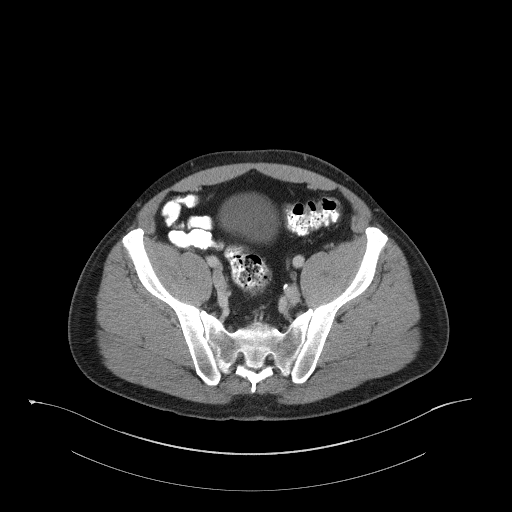
[im 37/109  soft-tissue]
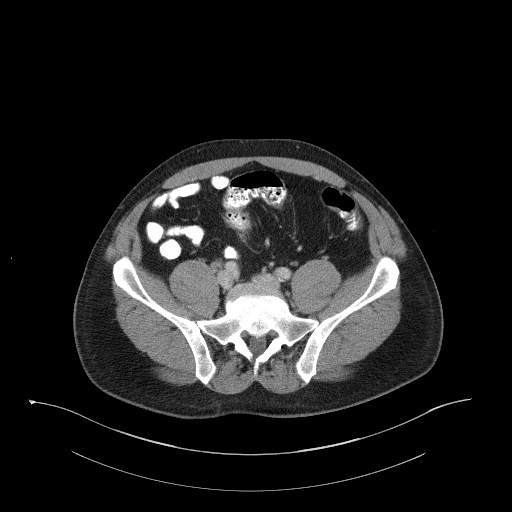
[im 49/109  soft-tissue]
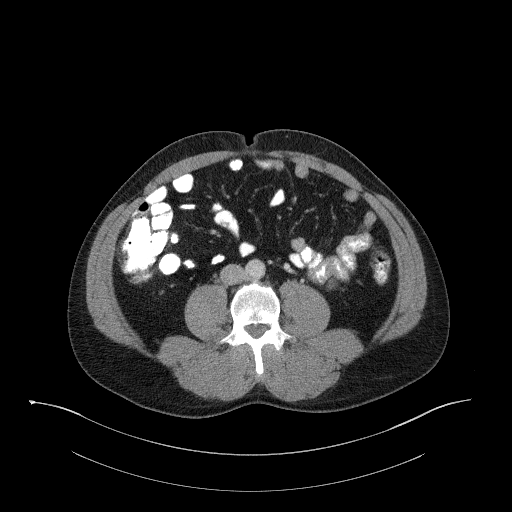
[im 55/109  soft-tissue]
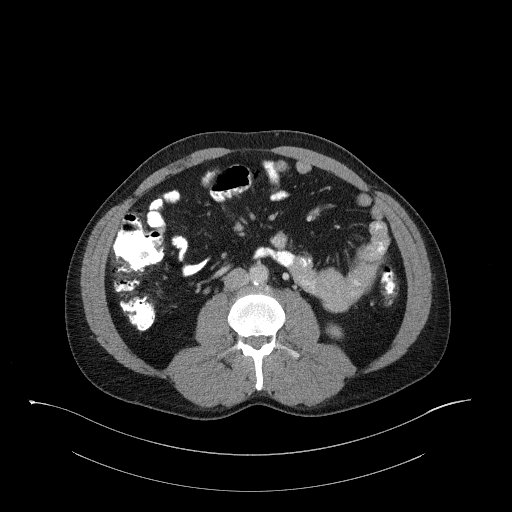
[im 61/109  soft-tissue]
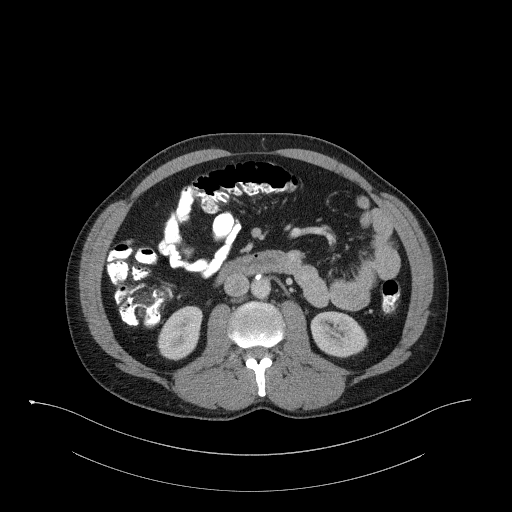
[im 73/109  soft-tissue]
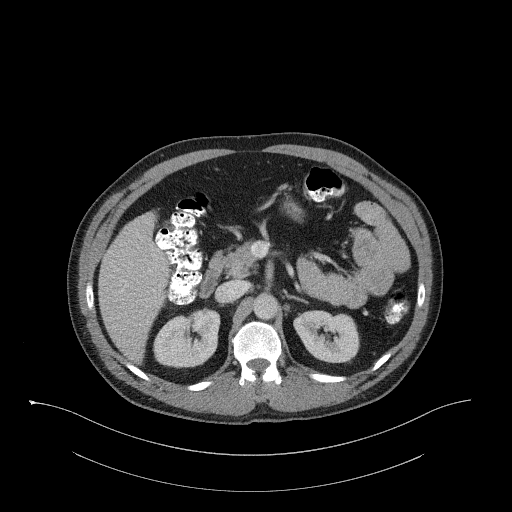
[im 73/109  bone]
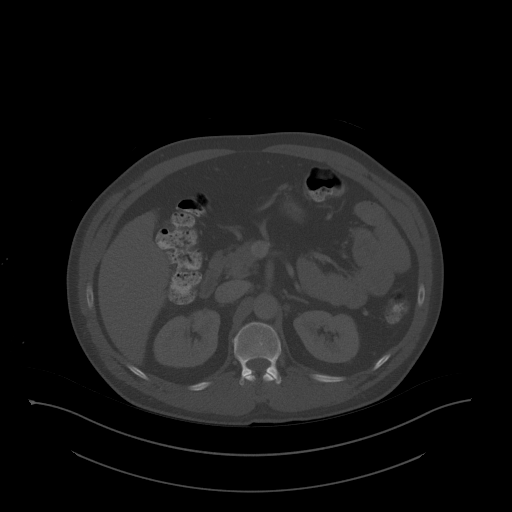
[im 79/109  soft-tissue]
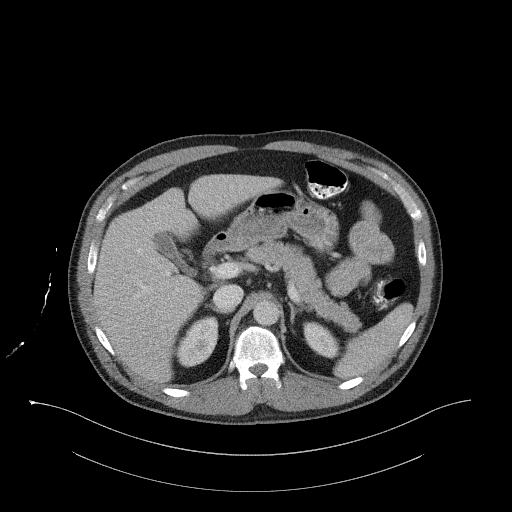
[im 85/109  soft-tissue]
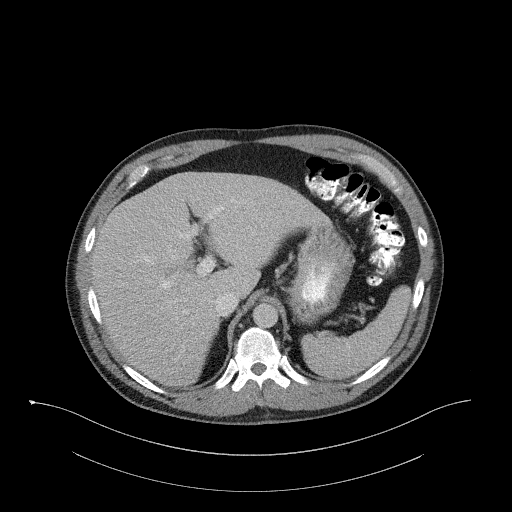
[im 97/109  soft-tissue]
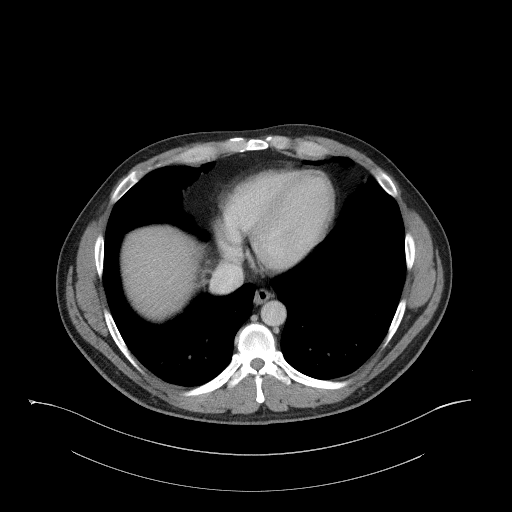
[im 103/109  soft-tissue]
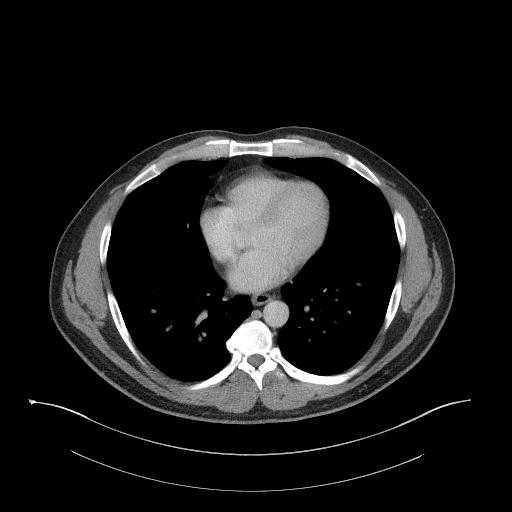

[Series 6: a/p w/ cor · coronal · 0.93mm/px · 3 of 162 slices shown]
[im 54/162  soft-tissue]
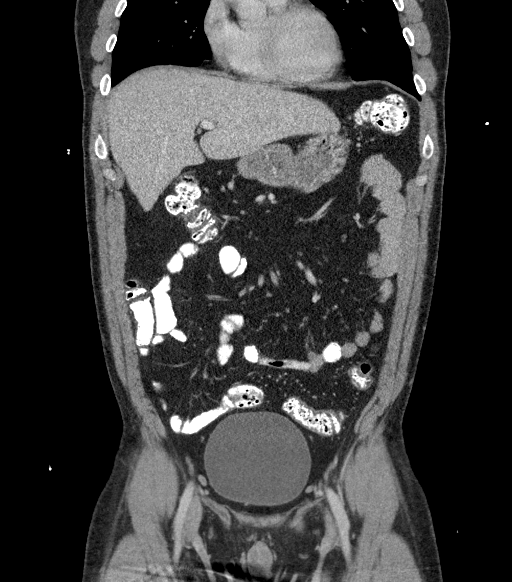
[im 72/162  soft-tissue]
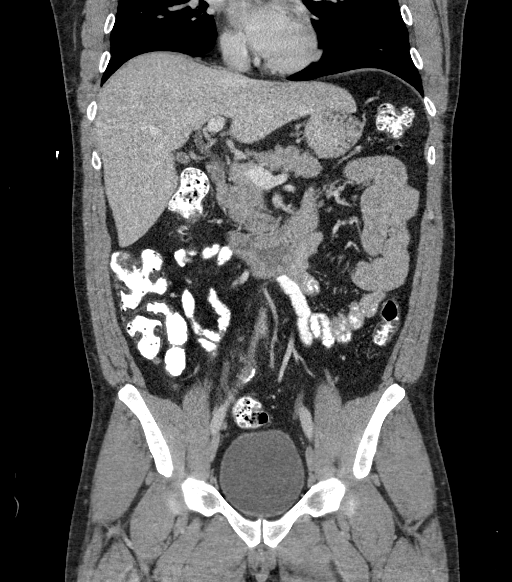
[im 90/162  soft-tissue]
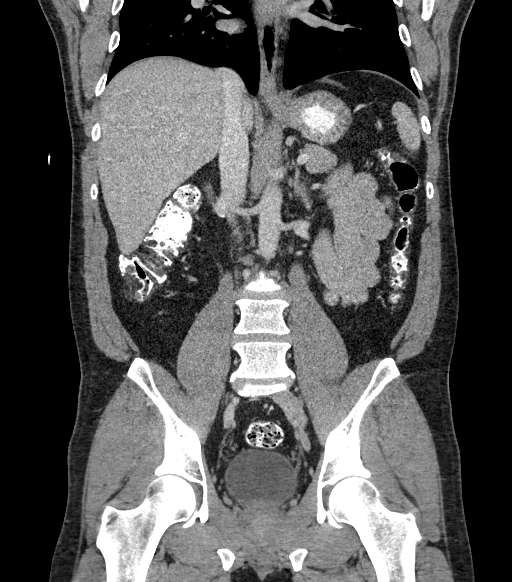

[16 of 46 positions shown; findings below may reference images not displayed]

FINDINGS: Lower chest: Minimal subsegmental atelectasis at medial RIGHT middle
lobe base

Hepatobiliary: Contracted gallbladder.  Liver normal appearance

Pancreas: Normal appearance

Spleen: Normal appearance

Adrenals/Urinary Tract: Adrenal glands, kidneys, ureters, and
bladder normal appearance

Stomach/Bowel: Normal appendix, unchanged. Ascending colon
incompletely distended. Stomach and bowel loops otherwise normal
appearance.

Vascular/Lymphatic: Vascular structures patent. Atherosclerotic
calcifications aorta without aneurysm. No adenopathy.

Reproductive: Unremarkable prostate gland and seminal vesicles

Other: No free air or free fluid.  No inflammatory process.

Musculoskeletal: Degenerative disc and facet disease changes of
lumbar spine greatest at L5-S1. A small gas containing focus is seen
medial to the LEFT facet joint at L5-S1, potentially exerting mass
effect at the LEFT lateral recess and LEFT S1 root.
IMPRESSION: No acute intra-abdominal or intrapelvic abnormalities.

Degenerative changes at L5-S1 with questionable mass effect upon the
LEFT S1 root at the LEFT lateral recess by a small gas containing
collection.

## 2020-12-18 ENCOUNTER — Encounter: Payer: Self-pay | Admitting: *Deleted

## 2020-12-18 ENCOUNTER — Ambulatory Visit
Admission: EM | Admit: 2020-12-18 | Discharge: 2020-12-18 | Disposition: A | Discharge status: Home / Self Care | Payer: Commercial Managed Care - PPO | Attending: Emergency Medicine | Admitting: Emergency Medicine

## 2020-12-18 ENCOUNTER — Other Ambulatory Visit: Payer: Self-pay

## 2020-12-18 DIAGNOSIS — M5441 Lumbago with sciatica, right side: Secondary | ICD-10-CM

## 2020-12-18 LAB — POCT URINALYSIS DIP (MANUAL ENTRY)
Bilirubin, UA: NEGATIVE
Blood, UA: NEGATIVE
Glucose, UA: NEGATIVE mg/dL
Ketones, POC UA: NEGATIVE mg/dL
Leukocytes, UA: NEGATIVE
Nitrite, UA: NEGATIVE
Protein Ur, POC: NEGATIVE mg/dL
Spec Grav, UA: <=1.005 — AB (ref 1.010–1.025)
Urobilinogen, UA: 0.2 E.U./dL
pH, UA: 5.5 (ref 5.0–8.0)

## 2020-12-18 MED ORDER — DEXAMETHASONE SODIUM PHOSPHATE 10 MG/ML IJ SOLN
10.0000 mg | Freq: Once | INTRAMUSCULAR | Status: AC
  Administered 2020-12-18: 10 mg via INTRAMUSCULAR

## 2020-12-18 MED ORDER — CYCLOBENZAPRINE HCL 10 MG PO TABS: 10.0000 mg | ORAL_TABLET | Freq: Two times a day (BID) | ORAL | 0 refills | Status: AC | PRN

## 2020-12-18 MED ORDER — PREDNISONE 10 MG (21) PO TBPK: ORAL_TABLET | Freq: Every day | ORAL | 0 refills | Status: AC

## 2020-12-18 NOTE — Discharge Instructions (Signed)
Steroid shot given in office Continue conservative management of rest, ice, and gentle stretches Prednisone prescribed.  Take as directed and to completion Take cyclobenzaprine at nighttime for symptomatic relief. Avoid driving or operating heavy machinery while using medication. Follow up with PCP if symptoms persist Return or go to the ER if you have any new or worsening symptoms (fever, chills, chest pain, abdominal pain, changes in bowel or bladder habits, pain radiating into lower legs, etc...)  

## 2020-12-18 NOTE — ED Triage Notes (Signed)
C/O being awakened from sleep with right flank pain.  Pain worse with movement.  Pt is truck driver. C/O nausea and had vomiting yesterday. Denies fevers.

## 2020-12-18 NOTE — ED Provider Notes (Signed)
Evan Bauer   [id number] 12/18/20 Arrival Time: 1207  CC: Low back PAIN  SUBJECTIVE: History from: patient. Evan Bauer is a 45 y.o. male complains of RT low back pain x few days.  Denies a precipitating event or specific injury.  Drives a truck for a living.  Localizes the pain to the RT low back.   Describes the pain as intermittent and sharp in character.  Has tried OTC medications without relief.  Symptoms are made worse with movement.  Reports similar symptoms in the past.  Complains of associated numbness in RLE.  Denies fever, chills, erythema, ecchymosis, effusion, weakness, saddle paresthesias, loss of bowel or bladder function.      ROS: As per HPI.  All other pertinent ROS negative.     Past Medical History:  Diagnosis Date  . Allergy    RHINITIS  . Anxiety   . Asthma   . COPD (chronic obstructive pulmonary disease) ([place])   . Depression   . GERD (gastroesophageal reflux disease)   . History of MRSA infection   . Pneumonia   . PONV (postoperative nausea and vomiting)    "patches have worked in the past"  . Smoker    Past Surgical History:  Procedure Laterality Date  . BACK SURGERY    . LUMBAR LAMINECTOMY/DECOMPRESSION MICRODISCECTOMY Left 10/31/2019   Procedure: Left Lumbar Five-Sacral One Microdiscectomy, Right Thoracic Eleven-Twelve Transpedicular Diskectomy;  Surgeon: Eustace Moore, MD;  Location: [facility];  Service: Neurosurgery;  Laterality: Left;  . ROTATOR CUFF REPAIR Left    Allergies  Allergen Reactions  . Chantix [Varenicline Tartrate] Other (See Comments)    REACTION: suicidal/homicidal thoughts  . Levofloxacin Anaphylaxis  . Penicillins Anaphylaxis    Did it involve swelling of the face/tongue/throat, SOB, or low BP? Yes Did it involve sudden or severe rash/hives, skin peeling, or any reaction on the inside of your mouth or nose? No Did you need to seek medical attention at a hospital or doctor's office? Yes When did it last  happen?childhood If all above answers are "NO", may proceed with cephalosporin use.    No current facility-administered medications on file prior to encounter.   Current Outpatient Medications on File Prior to Encounter  Medication Sig Dispense Refill  . albuterol (VENTOLIN HFA) 108 (90 BASE) MCG/ACT inhaler Inhale 2 puffs into the lungs every 6 (six) hours as needed for wheezing. 1 Inhaler 0  . pantoprazole (PROTONIX) 40 MG tablet Take 1 tablet (40 mg total) by mouth 2 (two) times daily. 60 tablet 1  . [DISCONTINUED] desvenlafaxine (PRISTIQ) 50 MG 24 hr tablet Take 1 tablet (50 mg total) by mouth daily. (Patient not taking: No sig reported) 90 tablet 1   Social History   Socioeconomic History  . Marital status: Married    Spouse name: Not on file  . Number of children: Not on file  . Years of education: Not on file  . Highest education level: Not on file  Occupational History  . Not on file  Tobacco Use  . Smoking status: Current Every Day Smoker    Packs/day: 1.50    Types: Cigarettes  . Smokeless tobacco: Never Used  Vaping Use  . Vaping Use: Never used  Substance and Sexual Activity  . Alcohol use: No  . Drug use: No  . Sexual activity: Not on file  Other Topics Concern  . Not on file  Social History Narrative  . Not on file   Social Determinants of Health  Financial Resource Strain: Not on file  Food Insecurity: Not on file  Transportation Needs: Not on file  Physical Activity: Not on file  Stress: Not on file  Social Connections: Not on file  Intimate Partner Violence: Not on file   Family History  Problem Relation Age of Onset  . Stroke Father   . Sarcoidosis Mother     OBJECTIVE:  Vitals:   12/18/20 1221  BP: (!) 144/92  Pulse: 96  Resp: 20  Temp: 98.1 F (36.7 C)  TempSrc: Oral  SpO2: 95%    General appearance: ALERT; in no acute distress.  Head: NCAT Lungs: Normal respiratory effort Musculoskeletal: Back Inspection: Skin warm,  dry, clear and intact without obvious erythema, effusion, or ecchymosis.  Palpation: TTP over RT low back ROM: FROM active and passive Strength:  5/5 hip flexion, 5/5 hip extension Skin: warm and dry Neurologic: Ambulates without difficulty; Sensation intact about the upper/ lower extremities Psychological: alert and cooperative; normal mood and affect  ASSESSMENT & PLAN:  1. Acute right-sided low back pain with right-sided sciatica     Meds ordered this encounter  Medications  . predniSONE (STERAPRED UNI-PAK 21 TAB) 10 MG (21) TBPK tablet    Sig: Take by mouth daily. Take 6 tabs by mouth daily  for 2 days, then 5 tabs for 2 days, then 4 tabs for 2 days, then 3 tabs for 2 days, 2 tabs for 2 days, then 1 tab by mouth daily for 2 days    Dispense:  42 tablet    Refill:  0    Order Specific Question:   Supervising Provider    Answer:   Raylene Everts [[id number]]  . cyclobenzaprine (FLEXERIL) 10 MG tablet    Sig: Take 1 tablet (10 mg total) by mouth 2 (two) times daily as needed for muscle spasms.    Dispense:  20 tablet    Refill:  0    Order Specific Question:   Supervising Provider    Answer:   Raylene Everts [[id number]]  . dexamethasone (DECADRON) injection 10 mg   Steroid shot given in office Continue conservative management of rest, ice, and gentle stretches Prednisone prescribed.  Take as directed and to completion Take cyclobenzaprine at nighttime for symptomatic relief. Avoid driving or operating heavy machinery while using medication. Follow up with PCP if symptoms persist Return or go to the ER if you have any new or worsening symptoms (fever, chills, chest pain, abdominal pain, changes in bowel or bladder habits, pain radiating into lower legs, etc...)   Reviewed expectations re: course of current medical issues. Questions answered. Outlined signs and symptoms indicating need for more acute intervention. Patient verbalized understanding. After Visit Summary  given.    Lestine Box, PA-C 12/18/20 1318

## 2021-01-20 IMAGING — CR DG THORACOLUMBAR SPINE 2V
3 series · 3 of 3 positions shown · non-contrast
Comparison: None.

CLINICAL DATA: Macro discectomy L5-S1 left, right T11-T12
transpedicular discectomy.

EXAM:
THORACOLUMBAR SPINE 1V

[AP (1 of 3)]
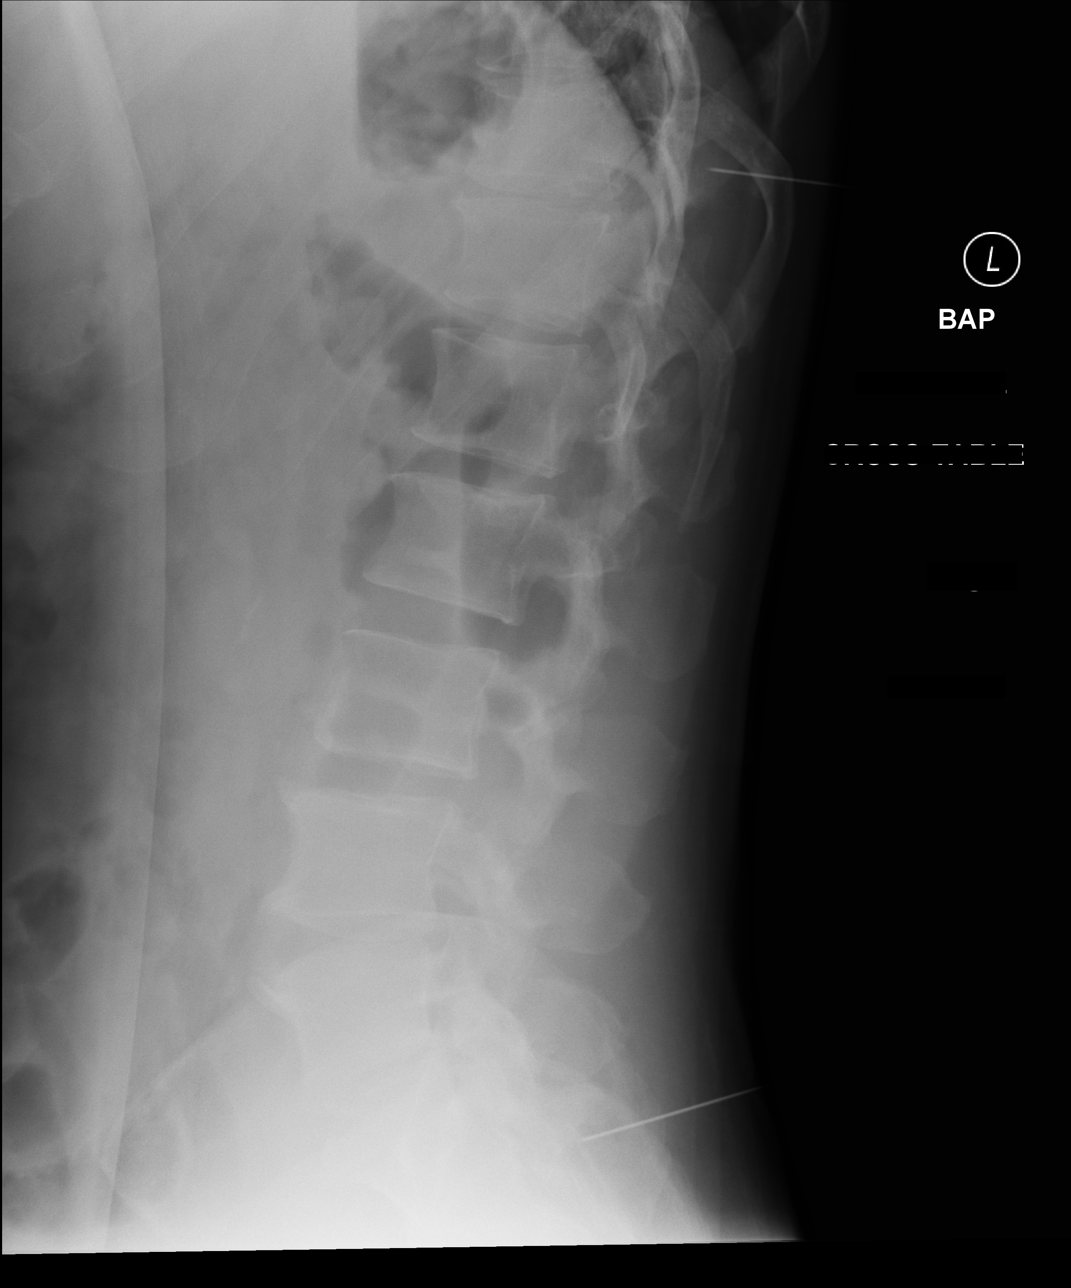

[AP (2 of 3)]
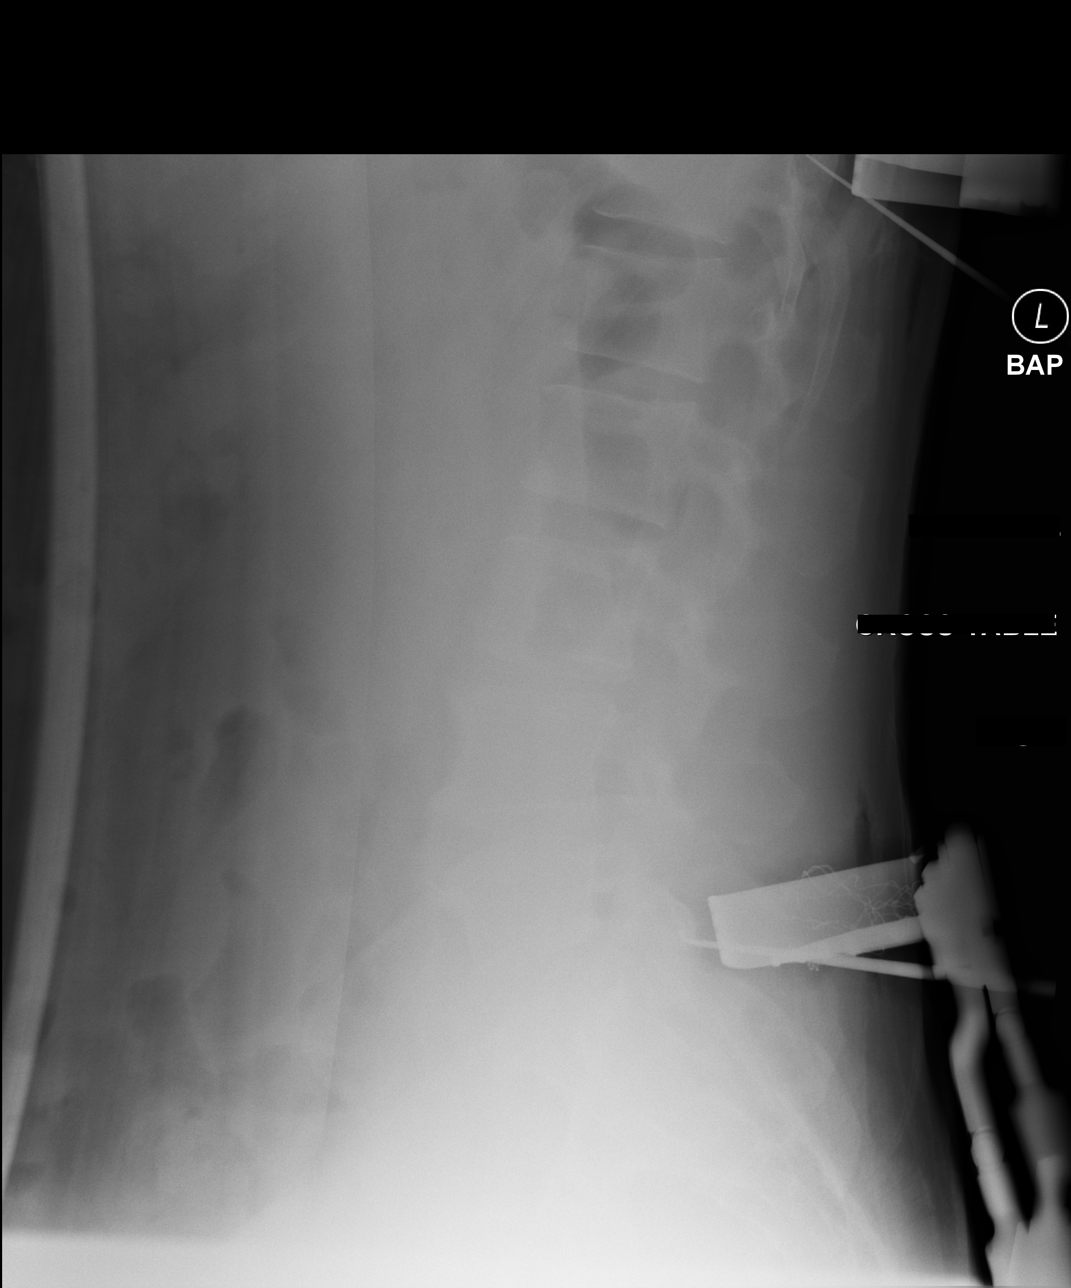

[AP (3 of 3)]
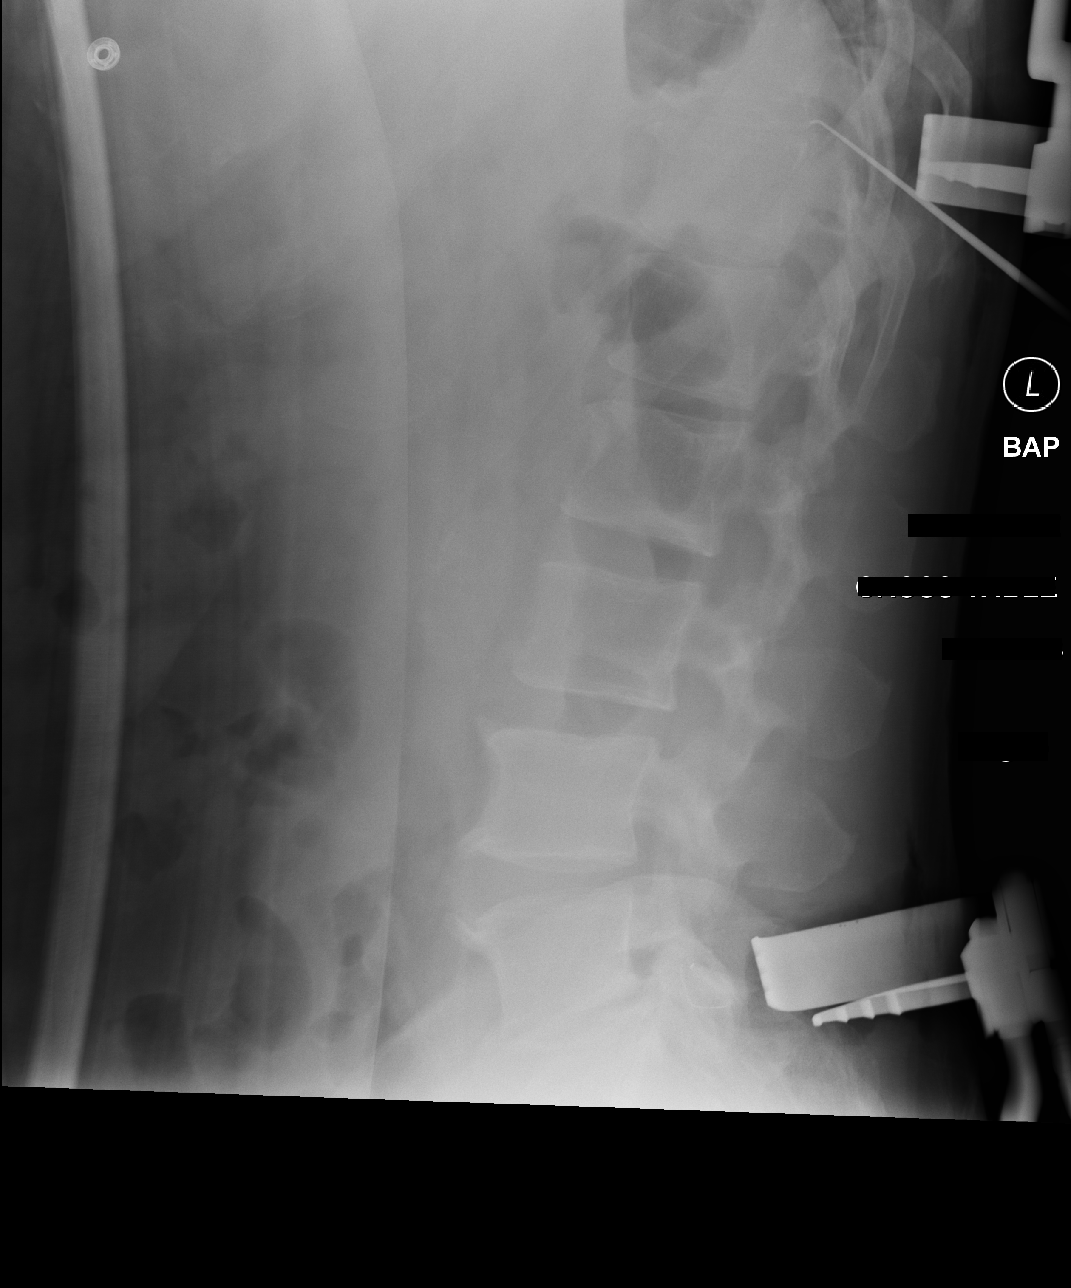

[3 of 3 positions shown; findings below may reference images not displayed]

FINDINGS: Three cross-table lateral views in the operating room provided.

Image 1 demonstrates surgical instruments localizing posterior to
T11 and S1.

Image 2 demonstrates surgical instruments localizing to L5-S1 and
T11-T12.

Image 3 demonstrates surgical instrument localizing posterior to
L5-S1 and T11-T12.
IMPRESSION: Intraoperative localization with surgical instruments localizing to
T11-T12 and L5-S1.

## 2021-02-18 DEATH — deceased

## 2021-05-11 ENCOUNTER — Encounter: Payer: Self-pay | Admitting: *Deleted
